# Patient Record
Sex: Female | Born: 1945 | Race: White | Hispanic: No | Smoking: Never smoker
Health system: Southern US, Community
[De-identification: ages and names within clinical notes are randomized; demographics above are authoritative.]

## PROBLEM LIST (undated history)

## (undated) DIAGNOSIS — I10 Essential (primary) hypertension: Secondary | ICD-10-CM

## (undated) DIAGNOSIS — E669 Obesity, unspecified: Secondary | ICD-10-CM

## (undated) DIAGNOSIS — K219 Gastro-esophageal reflux disease without esophagitis: Secondary | ICD-10-CM

## (undated) DIAGNOSIS — C801 Malignant (primary) neoplasm, unspecified: Secondary | ICD-10-CM

## (undated) DIAGNOSIS — N950 Postmenopausal bleeding: Secondary | ICD-10-CM

## (undated) DIAGNOSIS — F419 Anxiety disorder, unspecified: Secondary | ICD-10-CM

## (undated) DIAGNOSIS — I4891 Unspecified atrial fibrillation: Secondary | ICD-10-CM

## (undated) DIAGNOSIS — F329 Major depressive disorder, single episode, unspecified: Secondary | ICD-10-CM

## (undated) DIAGNOSIS — E785 Hyperlipidemia, unspecified: Secondary | ICD-10-CM

## (undated) DIAGNOSIS — F32A Depression, unspecified: Secondary | ICD-10-CM

## (undated) DIAGNOSIS — R600 Localized edema: Secondary | ICD-10-CM

## (undated) DIAGNOSIS — E119 Type 2 diabetes mellitus without complications: Secondary | ICD-10-CM

## (undated) DIAGNOSIS — N6019 Diffuse cystic mastopathy of unspecified breast: Secondary | ICD-10-CM

## (undated) DIAGNOSIS — R7303 Prediabetes: Secondary | ICD-10-CM

## (undated) HISTORY — PX: OTHER SURGICAL HISTORY: SHX169

## (undated) HISTORY — PX: BREAST CYST ASPIRATION: SHX578

## (undated) HISTORY — PX: CARPAL TUNNEL RELEASE: SHX101

## (undated) HISTORY — PX: FRACTURE SURGERY: SHX138

## (undated) HISTORY — PX: KNEE SURGERY: SHX244

---

## 2004-06-12 ENCOUNTER — Ambulatory Visit: Payer: Self-pay | Admitting: Internal Medicine

## 2005-07-19 ENCOUNTER — Ambulatory Visit: Payer: Self-pay | Admitting: Internal Medicine

## 2005-11-11 ENCOUNTER — Other Ambulatory Visit: Payer: Self-pay

## 2005-11-11 ENCOUNTER — Ambulatory Visit: Payer: Self-pay | Admitting: Unknown Physician Specialty

## 2006-11-21 ENCOUNTER — Ambulatory Visit: Payer: Self-pay | Admitting: Internal Medicine

## 2008-02-17 ENCOUNTER — Ambulatory Visit: Payer: Self-pay | Admitting: Internal Medicine

## 2008-08-06 ENCOUNTER — Emergency Department: Payer: Self-pay | Admitting: Emergency Medicine

## 2009-02-23 ENCOUNTER — Ambulatory Visit: Payer: Self-pay | Admitting: Internal Medicine

## 2010-03-15 ENCOUNTER — Ambulatory Visit: Payer: Self-pay | Admitting: Internal Medicine

## 2011-03-21 ENCOUNTER — Ambulatory Visit: Payer: Self-pay | Admitting: Internal Medicine

## 2012-04-08 ENCOUNTER — Ambulatory Visit: Payer: Self-pay | Admitting: Internal Medicine

## 2013-04-09 ENCOUNTER — Ambulatory Visit: Payer: Self-pay | Admitting: Internal Medicine

## 2013-06-04 ENCOUNTER — Ambulatory Visit: Payer: Self-pay | Admitting: Internal Medicine

## 2014-01-11 DIAGNOSIS — Z8639 Personal history of other endocrine, nutritional and metabolic disease: Secondary | ICD-10-CM | POA: Insufficient documentation

## 2014-04-12 ENCOUNTER — Ambulatory Visit: Payer: Self-pay | Admitting: Internal Medicine

## 2015-01-13 ENCOUNTER — Other Ambulatory Visit: Payer: Self-pay | Admitting: Internal Medicine

## 2015-01-13 DIAGNOSIS — Z1231 Encounter for screening mammogram for malignant neoplasm of breast: Secondary | ICD-10-CM

## 2015-04-14 ENCOUNTER — Ambulatory Visit
Admission: RE | Admit: 2015-04-14 | Discharge: 2015-04-14 | Disposition: A | Discharge status: Home / Self Care | Payer: Medicare Other | Source: Ambulatory Visit | Attending: Internal Medicine | Admitting: Internal Medicine

## 2015-04-14 DIAGNOSIS — Z1231 Encounter for screening mammogram for malignant neoplasm of breast: Secondary | ICD-10-CM

## 2015-04-15 ENCOUNTER — Other Ambulatory Visit: Payer: Self-pay | Admitting: Internal Medicine

## 2015-04-15 DIAGNOSIS — R928 Other abnormal and inconclusive findings on diagnostic imaging of breast: Secondary | ICD-10-CM

## 2015-04-28 ENCOUNTER — Ambulatory Visit
Admission: RE | Admit: 2015-04-28 | Discharge: 2015-04-28 | Disposition: A | Discharge status: Home / Self Care | Payer: Medicare Other | Source: Ambulatory Visit | Attending: Internal Medicine | Admitting: Internal Medicine

## 2015-04-28 DIAGNOSIS — R928 Other abnormal and inconclusive findings on diagnostic imaging of breast: Secondary | ICD-10-CM | POA: Diagnosis not present

## 2016-01-24 ENCOUNTER — Other Ambulatory Visit: Payer: Self-pay | Admitting: Internal Medicine

## 2016-01-24 DIAGNOSIS — Z1231 Encounter for screening mammogram for malignant neoplasm of breast: Secondary | ICD-10-CM

## 2016-04-16 ENCOUNTER — Ambulatory Visit: Payer: Medicare Other

## 2016-06-15 ENCOUNTER — Ambulatory Visit
Admission: RE | Admit: 2016-06-15 | Discharge: 2016-06-15 | Disposition: A | Discharge status: Home / Self Care | Payer: Medicare Other | Source: Ambulatory Visit | Attending: Internal Medicine | Admitting: Internal Medicine

## 2016-06-15 DIAGNOSIS — Z1231 Encounter for screening mammogram for malignant neoplasm of breast: Secondary | ICD-10-CM

## 2016-08-16 IMAGING — MG MM DIAG BREAST TOMO UNI LEFT
6 series · 6 of 14 positions shown · non-contrast
Comparison: Previous exam(s).

CLINICAL DATA: 69-year-old female with possible left breast
asymmetry. The patient states she has had chronic nipple inversion
for many years.

EXAM:
DIGITAL DIAGNOSTIC LEFT MAMMOGRAM WITH 3D TOMOSYNTHESIS AND CAD

[L MLO synth-2D]
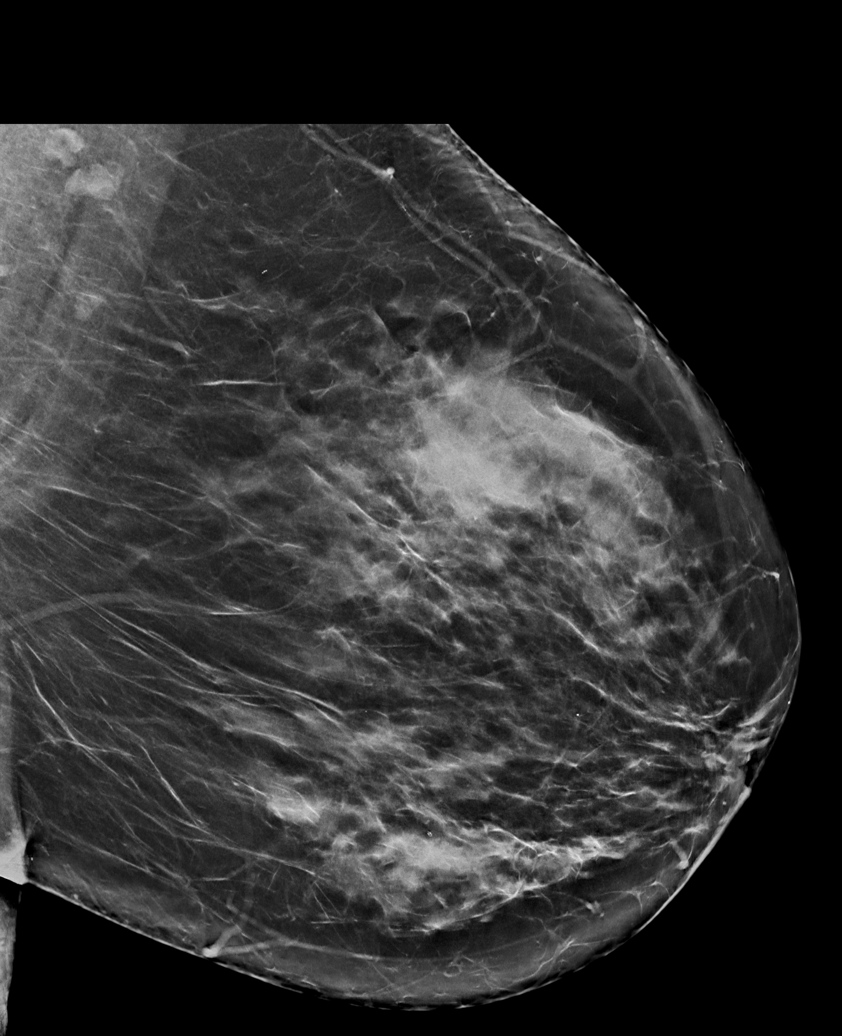

[L MLO]
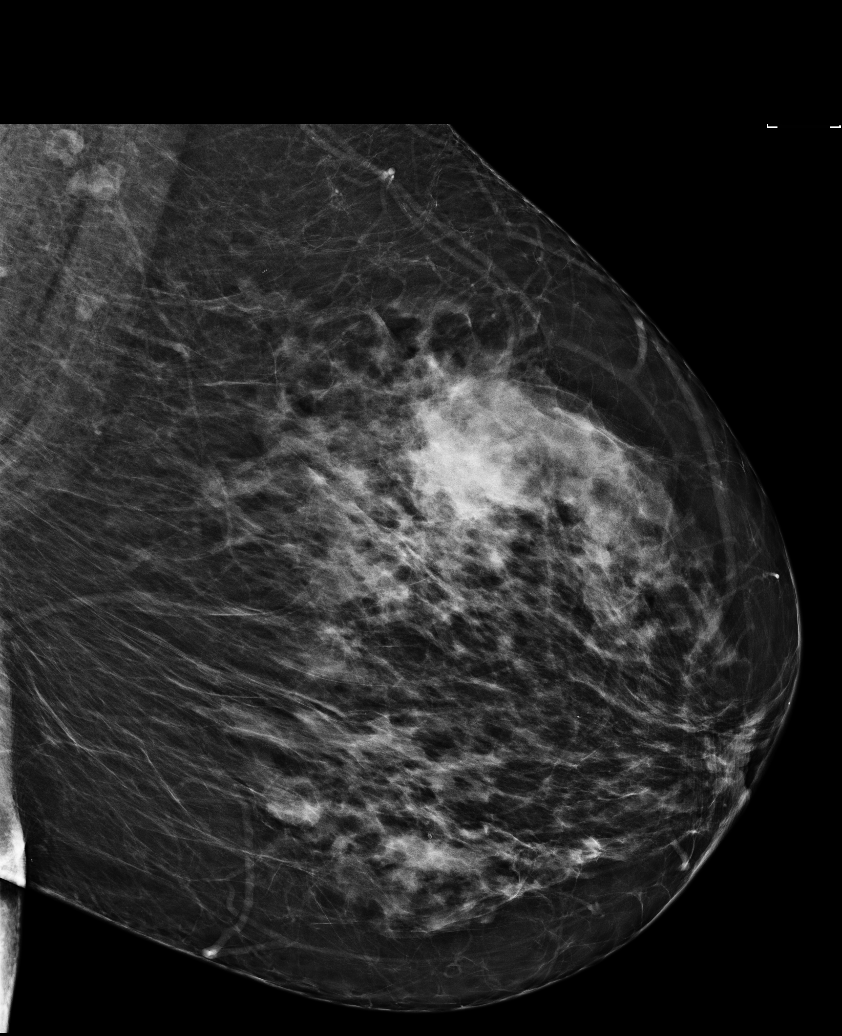

[L CC]
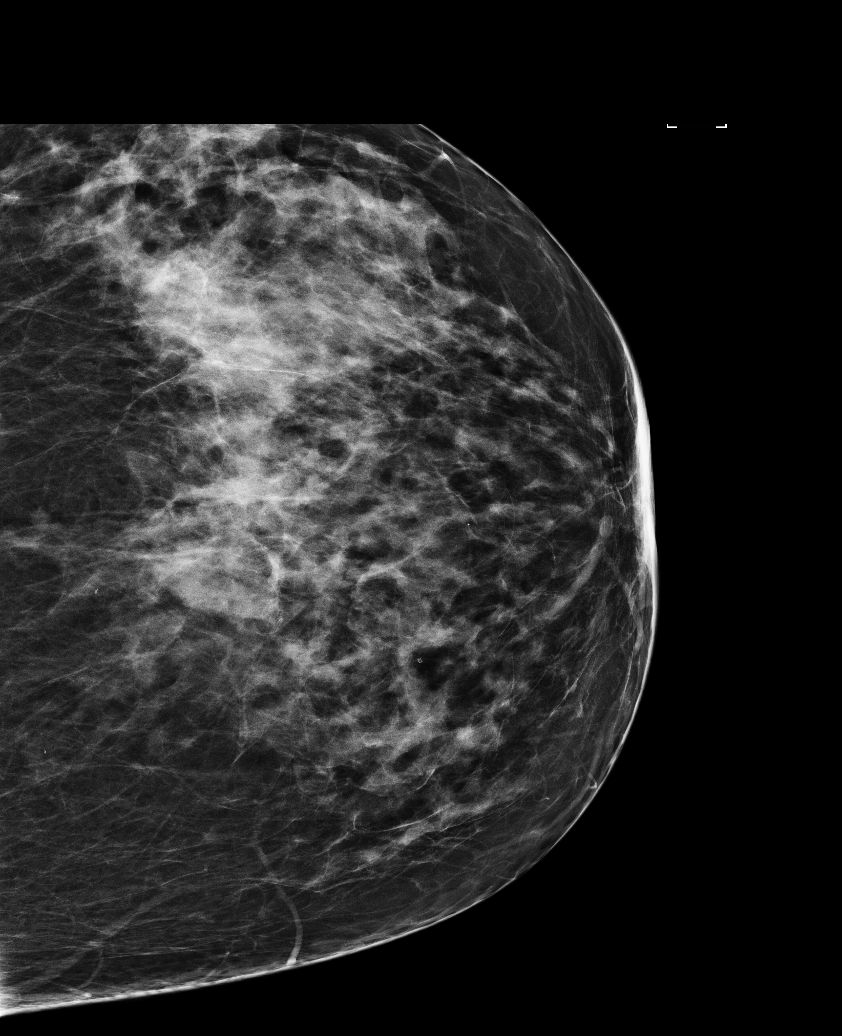

[L CC synth-2D]
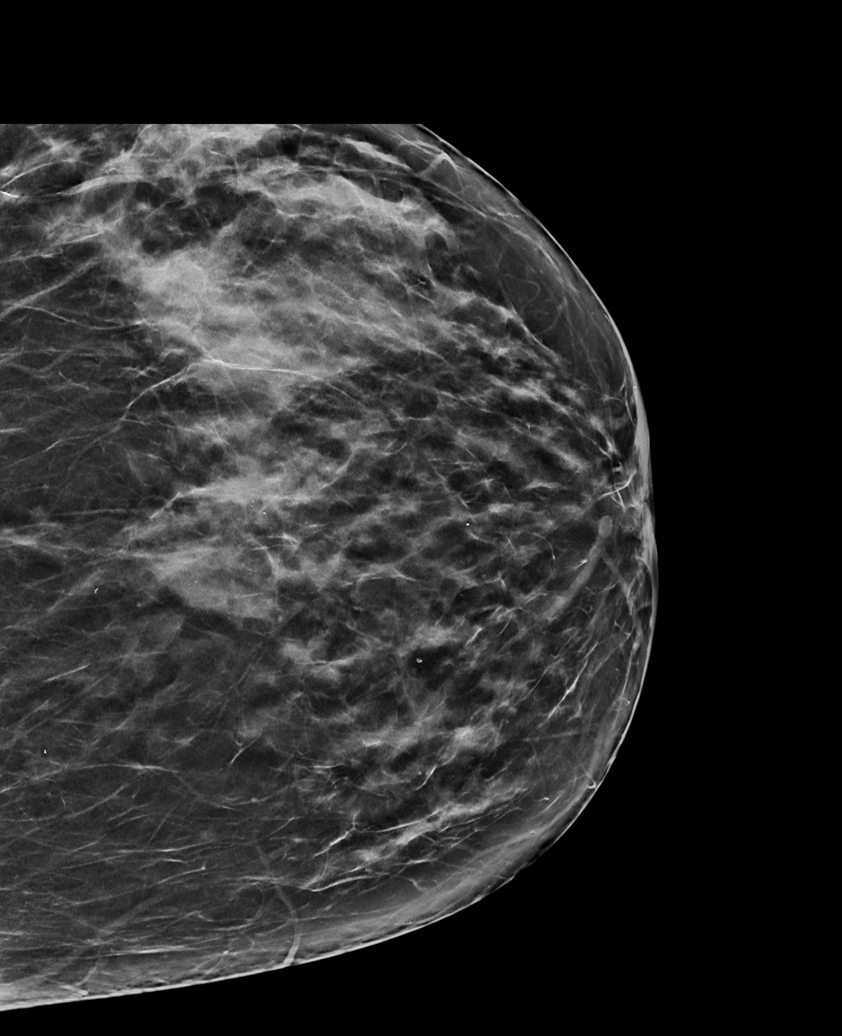

[L MLO tomo · tomo slice 47/94.0]
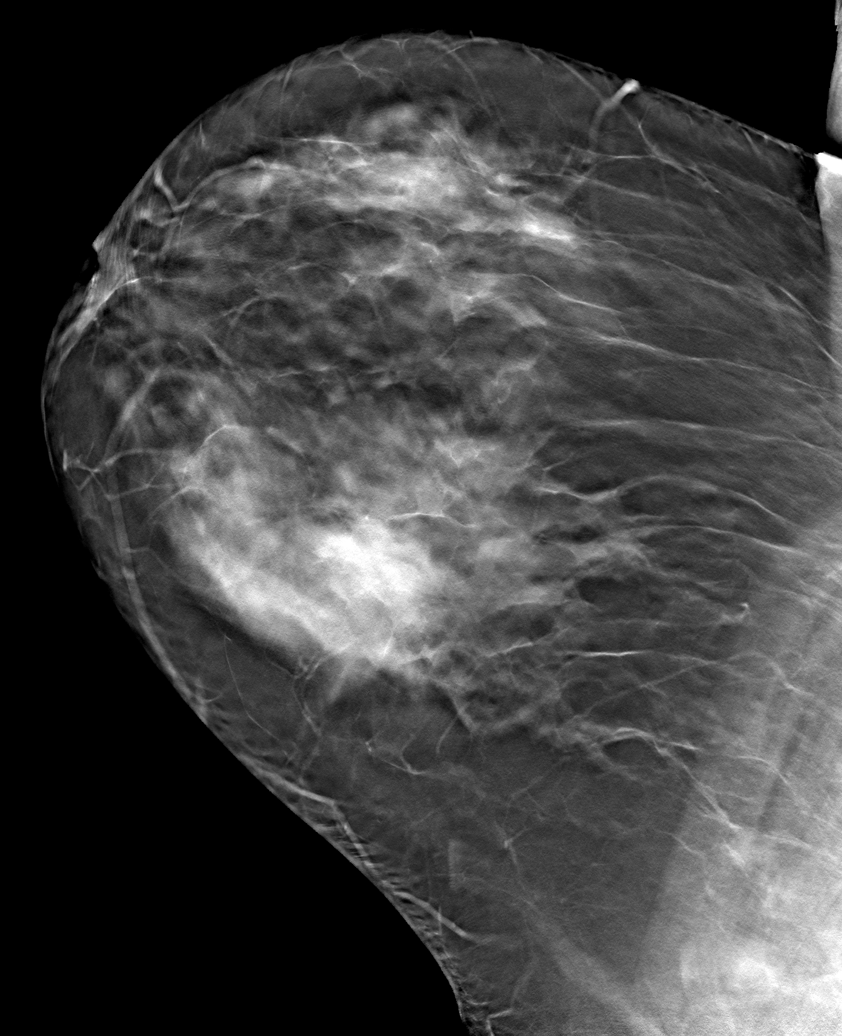

[L CC tomo · tomo slice 37/74.0]
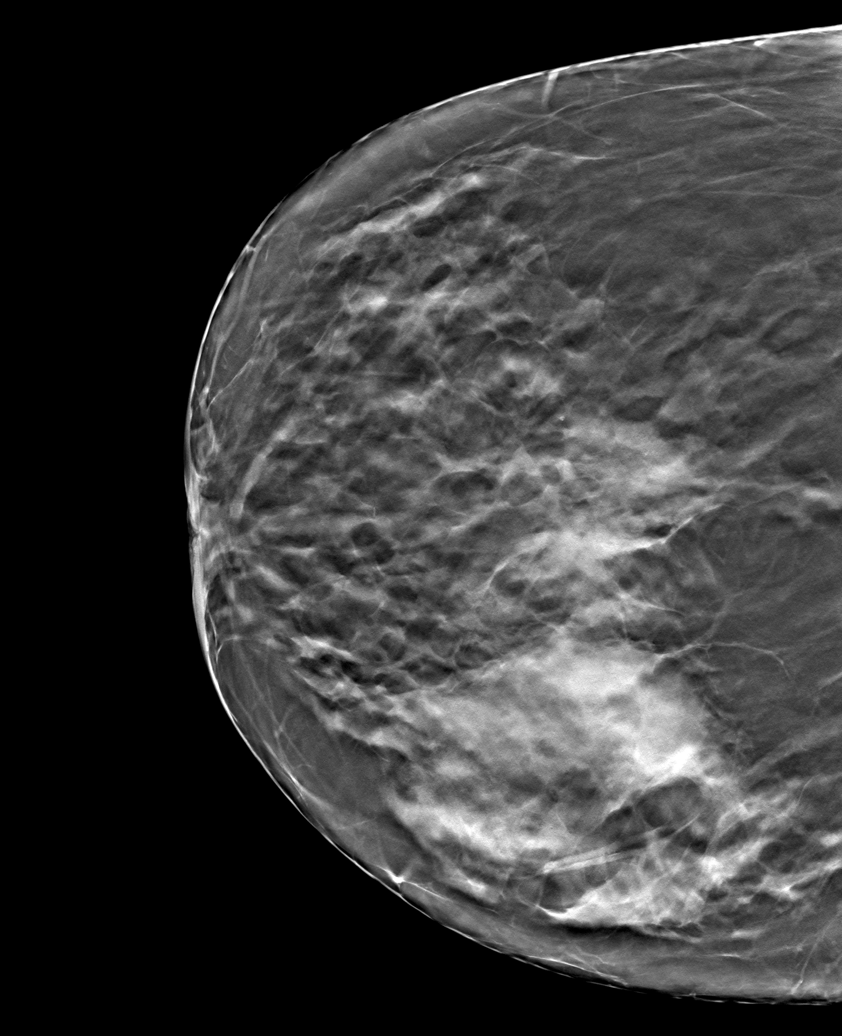

[6 of 14 positions shown; findings below may reference images not displayed]

ACR Breast Density Category b: There are scattered areas of
fibroglandular density.
FINDINGS: 2D and 3D full field views of the left breast demonstrate no
suspicious mass, distortion or worrisome calcifications.

No persistent abnormality in the area of the screening study finding
identified.

Mammographic images were processed with CAD.
IMPRESSION: No persistent abnormality in the area of the screening study
finding.

No mammographic evidence of left breast malignancy.

RECOMMENDATION:
Bilateral screening mammograms in 1 year.

I have discussed the findings and recommendations with the patient.
Results were also provided in writing at the conclusion of the
visit. If applicable, a reminder letter will be sent to the patient
regarding the next appointment.

BI-RADS CATEGORY  1: Negative.

## 2017-01-17 ENCOUNTER — Emergency Department
Admission: EM | Admit: 2017-01-17 | Discharge: 2017-01-17 | Disposition: A | Discharge status: Home / Self Care | Payer: Medicare Other | Attending: Emergency Medicine | Admitting: Emergency Medicine

## 2017-01-17 ENCOUNTER — Emergency Department: Payer: Medicare Other

## 2017-01-17 ENCOUNTER — Encounter: Payer: Self-pay | Admitting: *Deleted

## 2017-01-17 DIAGNOSIS — I4891 Unspecified atrial fibrillation: Secondary | ICD-10-CM

## 2017-01-17 DIAGNOSIS — Z7901 Long term (current) use of anticoagulants: Secondary | ICD-10-CM | POA: Insufficient documentation

## 2017-01-17 DIAGNOSIS — E876 Hypokalemia: Secondary | ICD-10-CM | POA: Insufficient documentation

## 2017-01-17 DIAGNOSIS — R000 Tachycardia, unspecified: Secondary | ICD-10-CM | POA: Diagnosis present

## 2017-01-17 LAB — PROTIME-INR
INR: 1.23
Prothrombin Time: 15.4 seconds — ABNORMAL HIGH (ref 11.4–15.2)

## 2017-01-17 LAB — CBC
HCT: 44.3 % (ref 35.0–47.0)
Hemoglobin: 15 g/dL (ref 12.0–16.0)
MCH: 30.4 pg (ref 26.0–34.0)
MCHC: 33.7 g/dL (ref 32.0–36.0)
MCV: 90.1 fL (ref 80.0–100.0)
PLATELETS: 274 10*3/uL (ref 150–440)
RBC: 4.92 MIL/uL (ref 3.80–5.20)
RDW: 14.8 % — AB (ref 11.5–14.5)
WBC: 11.4 10*3/uL — AB (ref 3.6–11.0)

## 2017-01-17 LAB — BASIC METABOLIC PANEL
Anion gap: 12 (ref 5–15)
BUN: 13 mg/dL (ref 6–20)
CHLORIDE: 100 mmol/L — AB (ref 101–111)
CO2: 26 mmol/L (ref 22–32)
CREATININE: 0.91 mg/dL (ref 0.44–1.00)
Calcium: 9.7 mg/dL (ref 8.9–10.3)
GFR calc Af Amer: >60 mL/min (ref >60)
GFR calc non Af Amer: >60 mL/min (ref >60)
Glucose, Bld: 122 mg/dL — ABNORMAL HIGH (ref 65–99)
Potassium: 3.2 mmol/L — ABNORMAL LOW (ref 3.5–5.1)
SODIUM: 138 mmol/L (ref 135–145)

## 2017-01-17 LAB — TROPONIN I: Troponin I: <0.03 ng/mL (ref <0.03)

## 2017-01-17 MED ORDER — METOPROLOL TARTRATE 50 MG PO TABS
50.0000 mg | ORAL_TABLET | Freq: Once | ORAL | Status: AC
  Administered 2017-01-17: 50 mg via ORAL
  Filled 2017-01-17: qty 1

## 2017-01-17 MED ORDER — POTASSIUM CHLORIDE CRYS ER 20 MEQ PO TBCR
40.0000 meq | EXTENDED_RELEASE_TABLET | Freq: Once | ORAL | Status: AC
  Administered 2017-01-17: 40 meq via ORAL
  Filled 2017-01-17: qty 2

## 2017-01-17 MED ORDER — METOPROLOL TARTRATE 50 MG PO TABS
100.0000 mg | ORAL_TABLET | Freq: Two times a day (BID) | ORAL | 0 refills | Status: DC
Start: 2017-01-17 — End: 2022-09-03

## 2017-01-17 MED ORDER — DILTIAZEM HCL 60 MG PO TABS
120.0000 mg | ORAL_TABLET | Freq: Once | ORAL | Status: AC
  Administered 2017-01-17: 120 mg via ORAL
  Filled 2017-01-17: qty 2

## 2017-01-17 NOTE — Discharge Instructions (Signed)
PLEASE STOP YOUR LOSARTAN/HCTZ.  INCREASE YOUR METOPROLOL TO 100mg  TWICE DAILY.  Continue all your other medications as prescribed.  Return to the emergency department if you develop severe pain, shortness breath, lightheadedness or fainting, or any other symptoms concerning to you.

## 2017-01-17 NOTE — ED Triage Notes (Addendum)
Pt to triage via wheelchair,.  Pt reports rapid heart rate.  No chest pain.  No sob.  Pt had episode of diaphoresis this am x 2.  Pt alert   Speech clear.   New onset of afib 1 month ago and is now on blood thinners.  Treated by dr.  Nehemiah Massed

## 2017-01-17 NOTE — ED Provider Notes (Addendum)
[facility] Emergency Department Provider Note  ____________________________________________  Time seen: Approximately 6:29 PM  I have reviewed the triage vital signs and the nursing notes.   HISTORY  Chief Complaint Tachycardia    HPI Brandi Poole is a 71 y.o. female with a history of afib on Eliquis, metoprolol, diltiazem and Losartan/HCTZ presenting w/ palpitations.  The patient reports that earlier today, she developed diaphoresis, with associated palpitations. She then had a discomfort in her left arm. She denies any chest pain, tightness or pressure. She has not had any lower extremity swelling or calf pain. No shortness of breath. The patient noted that her heart rate was fast, so came into the emergency department for further evaluation. Given her morning medications but not her evening doses. At this time, the patient is feeling significantly better and on my examination her heart rate is in the low 100s.   No past medical history on file.  There are no active problems to display for this patient.   No past surgical history on file.    Allergies Patient has no known allergies.  Family History  Problem Relation Age of Onset  . Breast cancer Neg Hx     Social History Social History  Substance Use Topics  . Smoking status: Never Smoker  . Smokeless tobacco: Never Used  . Alcohol use No    Review of Systems Constitutional: No fever/chills.No lightheadedness or syncope. Positive diaphoresis. Eyes: No visual changes. ENT: No sore throat. No congestion or rhinorrhea. Cardiovascular: Denies chest pain. Positive palpitations. Respiratory: Denies shortness of breath.  No cough. Gastrointestinal: No abdominal pain.  No nausea, no vomiting.  No diarrhea.  No constipation. Genitourinary: Negative for dysuria. Musculoskeletal: Negative for back pain. No lower extremity swelling or calf pain. Skin: Negative for rash. Neurological: Negative  for headaches. No focal numbness, tingling or weakness.  Psych: + Anxiety   ____________________________________________   PHYSICAL EXAM:  VITAL SIGNS: ED Triage Vitals  Enc Vitals Group     BP 01/17/17 1739 137/79     Pulse Rate 01/17/17 1739 (!) 134     Resp 01/17/17 1739 20     Temp 01/17/17 1739 98.6 F (37 C)     Temp Source 01/17/17 1739 Oral     SpO2 01/17/17 1739 96 %     Weight 01/17/17 1723 197 lb (89.4 kg)     Height 01/17/17 1723 5\' 4"  (1.626 m)     Head Circumference --      Peak Flow --      Pain Score --      Pain Loc --      Pain Edu? --      Excl. in [place]? --      Constitutional: Alert and oriented. Anxious appearing but in no acute distress. Answers questions appropriately. Eyes: Conjunctivae are normal.  EOMI. No scleral icterus. Head: Atraumatic. Nose: No congestion/rhinnorhea. Mouth/Throat: Mucous membranes are moist.  Neck: No stridor.  Supple.  No JVD. No meningismus. Cardiovascular: Fast rate,irregular rhythm. No murmurs, rubs or gallops.  Respiratory: Normal respiratory effort.  No accessory muscle use or retractions. Lungs CTAB.  No wheezes, rales or ronchi. Gastrointestinal: Obese. Soft, nontender and nondistended.  No guarding or rebound.  No peritoneal signs. Musculoskeletal: No LE edema. No ttp in the calves or palpable cords.  Negative Homan's sign. Neurologic:  A&Ox3.  Speech is clear.  Face and smile are symmetric.  EOMI.  Moves all extremities well. Skin:  Skin is warm,  dry and intact. No rash noted. Psychiatric: The patient is anxious mood and affect.Marland Kitchen Speech and behavior are normal.  Normal judgement.* ____________________________________________   LABS (all labs ordered are listed, but only abnormal results are displayed)  Labs Reviewed  BASIC METABOLIC PANEL - Abnormal; Notable for the following:       Result Value   Potassium 3.2 (*)    Chloride 100 (*)    Glucose, Bld 122 (*)    All other components within normal limits  CBC  - Abnormal; Notable for the following:    WBC 11.4 (*)    RDW 14.8 (*)    All other components within normal limits  PROTIME-INR - Abnormal; Notable for the following:    Prothrombin Time 15.4 (*)    All other components within normal limits  TROPONIN I   ____________________________________________  EKG  ED ECG REPORT I, Eula Listen, the attending physician, personally viewed and interpreted this ECG.   Date: 01/17/2017  EKG Time: 1725  Rate: 136  Rhythm: atrial fibrillation, with rapid ventricular rate  Axis: leftward  Intervals:none  ST&T Change: No STEMI  ____________________________________________  RADIOLOGY  Dg Chest 2 View  Result Date: 01/17/2017 CLINICAL DATA:  Tachycardia. EXAM: CHEST  2 VIEW COMPARISON:  None. FINDINGS: The heart size and mediastinal contours are within normal limits. Mild central pulmonary vascular congestion is noted. Atherosclerosis of thoracic aorta is noted. No pneumothorax or pleural effusion is noted. Both lungs are clear. The visualized skeletal structures are unremarkable. IMPRESSION: Mild central pulmonary vascular congestion.  Aortic atherosclerosis. Electronically Signed   By: Marijo Conception, M.D.   On: 01/17/2017 18:19    ____________________________________________   PROCEDURES  Procedure(s) performed: None  Procedures  Critical Care performed: yes ____________________________________________   INITIAL IMPRESSION / ASSESSMENT AND PLAN / ED COURSE  Pertinent labs & imaging results that were available during my care of the patient were reviewed by me and considered in my medical decision making (see chart for details).  71 y.o. female with newly diagnosed atrial fibrillation, already anticoagulated, presenting with palpitations and an EKG consistent with A. fib with RVR. It is likely that the patient is not yet optimized on her medications, and I will treat her with diltiazem and metoprolol here. We will also look  for any evidence of ACS or MI, and at this time her EKG is reassuring without ischemic changes. We will check her electrolytes, and reevaluate the patient for final disposition.  ----------------------------------------- 6:55 PM on 01/17/2017 -----------------------------------------  The patient's heart rate has normalized after rate control medications. I spoken with Dr. Drusilla Kanner, the patient's primary cardiologist, who agrees with the plan for discharge. His recommendation is for the patient to stop her losartan/HCTZ and increase her metoprolol to 100 mg twice a day. I have given her these instructions, as well as precautions for orthostasis or risk of associated hypotension. The patient understands return precautions as well as follow-up instructions.  CRITICAL CARE Performed by: Eula Listen   Total critical care time: 35 minutes  Critical care time was exclusive of separately billable procedures and treating other patients.  Critical care was necessary to treat or prevent imminent or life-threatening deterioration.  Critical care was time spent personally by me on the following activities: development of treatment plan with patient and/or surrogate as well as nursing, discussions with consultants, evaluation of patient's response to treatment, examination of patient, obtaining history from patient or surrogate, ordering and performing treatments and interventions, ordering and review of laboratory  studies, ordering and review of radiographic studies, pulse oximetry and re-evaluation of patient's condition.   ____________________________________________  FINAL CLINICAL IMPRESSION(S) / ED DIAGNOSES  Final diagnoses:  Hypokalemia  Atrial fibrillation with RVR (HCC)         NEW MEDICATIONS STARTED DURING THIS VISIT:  New Prescriptions   METOPROLOL TARTRATE (LOPRESSOR) 50 MG TABLET    Take 2 tablets (100 mg total) by mouth 2 (two) times daily.      Eula Listen, MD 01/17/17 Kathaleen Maser    Eula Listen, MD 01/17/17 [phone]

## 2017-01-31 ENCOUNTER — Encounter
Admission: RE | Admit: 2017-01-31 | Discharge: 2017-01-31 | Disposition: A | Discharge status: Home / Self Care | Payer: Medicare Other | Source: Ambulatory Visit | Attending: Obstetrics & Gynecology | Admitting: Obstetrics & Gynecology

## 2017-01-31 HISTORY — DX: Gastro-esophageal reflux disease without esophagitis: K21.9

## 2017-01-31 HISTORY — DX: Essential (primary) hypertension: I10

## 2017-01-31 HISTORY — DX: Major depressive disorder, single episode, unspecified: F32.9

## 2017-01-31 HISTORY — DX: Unspecified atrial fibrillation: I48.91

## 2017-01-31 HISTORY — DX: Depression, unspecified: F32.A

## 2017-01-31 HISTORY — DX: Type 2 diabetes mellitus without complications: E11.9

## 2017-01-31 HISTORY — DX: Anxiety disorder, unspecified: F41.9

## 2017-01-31 NOTE — Patient Instructions (Signed)
Your procedure is scheduled on: 02-08-17 FRIDAY Report to Same Day Surgery 2nd floor medical mall [facility] Entrance-take elevator on left to 2nd floor.  Check in with surgery information desk.) To find out your arrival time please call [phone] between 1PM - 3PM on 02-07-17 THURSDAY  Remember: Instructions that are not followed completely may result in serious medical risk, up to and including death, or upon the discretion of your surgeon and anesthesiologist your surgery may need to be rescheduled.    _x___ 1. Do not eat food after midnight the night before your procedure. NO GUM CHEWING OR HARD CANDIES.  You may drink clear liquids up to 2 hours before you are scheduled to arrive at the hospital for your procedure.  Do not drink clear liquids within 2 hours of your scheduled arrival to the hospital.  Clear liquids include  --Water or Apple juice without pulp  --Clear carbohydrate beverage such as ClearFast or Gatorade  --Black Coffee or Clear Tea (No milk, no creamers, do not add anything to the coffee or Tea)  Type 1 and type 2 diabetics should only drink water.     __x__ 2. No Alcohol for 24 hours before or after surgery.   __x__3. No Smoking for 24 prior to surgery.   ____  4. Bring all medications with you on the day of surgery if instructed.    __x__ 5. Notify your doctor if there is any change in your medical condition     (cold, fever, infections).     Do not wear jewelry, make-up, hairpins, clips or nail polish.  Do not wear lotions, powders, or perfumes. You may wear deodorant.  Do not shave 48 hours prior to surgery. Men may shave face and neck.  Do not bring valuables to the hospital.    [facility] is not responsible for any belongings or valuables.               Contacts, dentures or bridgework may not be worn into surgery.  Leave your suitcase in the car. After surgery it may be brought to your room.  For patients admitted to the hospital, discharge time  is determined by your treatment team.   Patients discharged the day of surgery will not be allowed to drive home.  You will need someone to drive you home and stay with you the night of your procedure.    Please read over the following fact sheets that you were given:   [facility] Preparing for Surgery and or MRSA Information   _x___ TAKE THE FOLLOWING MEDICATIONS THE MORNING OF SURGERY WITH A SMALL SIP OF WATER. These include:  1. LIPITOR (ATORVASTATIN)  2. DILITAZEM  3. METOPROLOL  4. PEPCID (FAMOTIDINE)  5. TAKE AN EXTRA PEPCID ON Thursday NIGHT BEFORE BED (02-07-17)  6.  ____Fleets enema or Magnesium Citrate as directed.   _x___ Use CHG Soap or sage wipes as directed on instruction sheet   ____ Use inhalers on the day of surgery and bring to hospital day of surgery  ____ Stop Metformin and Janumet 2 days prior to surgery.    ____ Take 1/2 of usual insulin dose the night before surgery and none on the morning surgery.   _x___ Follow recommendations from Cardiologist, Pulmonologist or PCP regarding stopping Aspirin, Coumadin, Plavix ,Eliquis, Effient, or Pradaxa, and Pletal-CALL EITHER DR WARDS OR DR Fabiola Backer OFFICE REGARDING YOUR COUMADIN  X____Stop Anti-inflammatories such as Advil, Aleve, Ibuprofen, Motrin, Naproxen, Naprosyn, Goodies powders or aspirin products  NOW-OK to take Tylenol    ____ Stop supplements until after surgery.     ____ Bring C-Pap to the hospital.

## 2017-01-31 NOTE — Pre-Procedure Instructions (Signed)
Echo complete10/06/2016 Antoine Component Name Value Ref Range  LV Ejection Fraction (%) 50   Aortic Valve Regurgitation Grade trivial   Aortic Valve Stenosis Grade none   Aortic Valve Max Velocity (m/s) 1.3 m/sec  Aortic Valve Stenosis Mean Gradient (mmHg) 3.0 mmHg  Mitral Valve Regurgitation Grade mild   Mitral Valve Stenosis Grade none   Tricuspid Valve Regurgitation Grade mild   Tricuspid Valve Regurgitation Max Velocity (m/s) 2.9 m/sec  Right Ventricle Systolic Pressure (mmHg) 23.3 mmHg  LV End Diastolic Diameter (cm) 4.6 cm  LV End Systolic Diameter (cm) 3.5 cm  LV Septum Wall Thickness (cm) 0.83 cm  LV Posterior Wall Thickness (cm) 0.86 cm  Left Atrium Diameter (cm) 3.4 cm  Result Narrative  CARDIOLOGY DEPARTMENT EMIKA, TIANO [id number] [facility]ICEAcct #: [id number] [address], [place], [place] [address]Date: 01/30/2017 08:14 AM Adult Female Age: 71 yrs ECHOCARDIOGRAM REPORT Outpatient KC::KCWC  STUDY:CHEST WALL TAPE:0000:00: 0:00:00 MD1:KOWALSKI, BRUCE JAY ECHO:Yes DOPPLER:YesFILE:0000-000-000 BP: 108/72 mmHg  COLOR:YesCONTRAST:No MACHINE:Philips Height: 64 in  RV BIOPSY:No 3D:NoSOUND QLTY:ModerateWeight: 198 lb MEDIUM:None BSA: 1.9 m2  ___________________________________________________________________________________________  HISTORY:Recent A.Fib REASON:Assess, LV function INDICATION:I48.91 Unspecified atrial  fibrillation ___________________________________________________________________________________________  ECHOCARDIOGRAPHIC MEASUREMENTS 2D DIMENSIONS AORTA ValuesNormal RangeMAIN PAValuesNormal Range Annulus:nm* [2.1 - 2.5]PA Main:nm* [1.5 - 2.1] Aorta Sin:nm* [2.7 - 3.3] RIGHT VENTRICLE ST Junction:nm* [2.3 - 2.9]RV Base:nm* [ < 4.2] Asc.Aorta:nm* [2.3 - 3.1] RV Mid:nm* [ < 3.5]  LEFT VENTRICLERV Length:nm* [ < 8.6] LVIDd:4.6 cm[3.9 - 5.3] INFERIOR VENA CAVA LVIDs:3.5 cmMax. IVC:nm* [ <= 2.1]  FS:25.6 %[> 25]Min. IVC:nm* SWT:0.83 cm [0.5 - 0.9] ------------------ PWT:0.86 cm [0.5 - 0.9] nm* - not measured  LEFT ATRIUM LA Diam:3.4 cm[2.7 - 3.8] LA A4C Area:nm* [ < 20] LA Volume:nm* [22 - 52] ___________________________________________________________________________________________  ECHOCARDIOGRAPHIC DESCRIPTIONS  AORTIC ROOT Size:Normal Dissection:INDETERM FOR DISSECTION  AORTIC VALVE Leaflets:TricuspidMorphology:Normal Mobility:Fully mobile  LEFT VENTRICLE Size:Normal Anterior:Normal  Contraction:NormalLateral:Normal Closest EF:50% (Estimated)Septal:Normal  LV Masses:No MassesApical:Normal  YBW:LSLH Inferior:Normal  Posterior:Normal Dias.FxClass:can't be determined  MITRAL VALVE Leaflets:Normal Mobility:Fully mobile Morphology:Normal  LEFT  ATRIUM Size:MILDLY ENLARGED LA Masses:No masses  IA Septum:Normal IAS  MAIN PA Size:Normal  PULMONIC VALVE Morphology:Normal Mobility:Fully mobile  RIGHT VENTRICLE  RV Masses:No MassesSize:Normal  Free Wall:NormalContraction:Normal  TRICUSPID VALVE Leaflets:Normal Mobility:Fully mobile Morphology:Normal  RIGHT ATRIUM Size:Normal RA Other:None  RA Mass:No masses  PERICARDIUM  Fluid:No effusion  INFERIOR VENACAVA Size:Normal Normal respiratory collapse  ____________________________________________________________________  DOPPLER ECHO and OTHER SPECIAL PROCEDURES  Aortic:TRIVIAL AR No AS 129.0 cm/sec peak vel6.7 mmHg peak grad 3.0 mmHg mean grad 2.8 cm^2 by DOPPLER   Mitral:MILD MRNo MS MV Inflow E Vel=112.0 cm/sec MV Annulus E'Vel=nm* E/E'Ratio=nm*  Tricuspid:MILD TRNo TS 287.0 cm/sec peak TR vel 37.9 mmHg peak RV pressure  Pulmonary:TRIVIAL PR No PS    ___________________________________________________________________________________________  INTERPRETATION NORMAL LEFT VENTRICULAR SYSTOLIC FUNCTION WITH AN ESTIMATED EF = 50-55 % NORMAL RIGHT VENTRICULAR SYSTOLIC FUNCTION MILD-TO-MODERATE TRICUSPID VALVE INSUFFICIENCY MILD MITRAL VALVE INSUFFICIENCY TRACE AORTIC VALVE INSUFFICIENCY NO VALVULAR STENOSIS MILD LA ENLARGEMENT  ___________________________________________________________________________________________  Electronically signed by: MD Serafina Royals on 01/30/2017 09:49 AM Performed By: Johnathan Hausen, RDCS, RVT Ordering Physician: Serafina Royals ___________________________________________________________________________________________  Other  Result Information  Interface, Text Results In - 01/30/2017  9:51 AM EDT                     CARDIOLOGY DEPARTMENT  JENISE, IANNELLI                        [facility]                      [id number]                   Coral #: [id number]         [address] [place], [place] [address]        Date: 01/30/2017 08:14 AM                                                             Adult Female Age: 71 yrs                     ECHOCARDIOGRAM REPORT                   Outpatient                                                             KC::KCWC          STUDY:CHEST WALL         TAPE:0000:00: 0:00:00     MD1:  Serafina Royals JAY           ECHO:Yes   DOPPLER:Yes  FILE:0000-000-000         BP: 108/72 mmHg          COLOR:Yes  CONTRAST:No       MACHINE:Philips       Height: 64 in      RV BIOPSY:No         3D:No    SOUND QLTY:Moderate      Weight: 198 lb         MEDIUM:None                                         BSA: 1.9 m2  ___________________________________________________________________________________________            HISTORY:Recent A.Fib             REASON:Assess, LV function         INDICATION:I48.91 Unspecified atrial fibrillation ___________________________________________________________________________________________  ECHOCARDIOGRAPHIC MEASUREMENTS 2D DIMENSIONS AORTA             Values      Normal Range      MAIN PA          Values      Normal Range           Annulus:  nm*       [2.1 - 2.5]                PA Main:  nm*       [1.5 - 2.1]  Aorta Sin:  nm*       [2.7 - 3.3]       RIGHT VENTRICLE       ST Junction:  nm*       [2.3 - 2.9]                RV Base:  nm*       [ < 4.2]         Asc.Aorta:  nm*       [2.3 - 3.1]                 RV Mid:  nm*       [ < 3.5]  LEFT VENTRICLE                                        RV Length:  nm*       [ < 8.6]             LVIDd:  4.6 cm    [3.9 - 5.3]       INFERIOR VENA  CAVA             LVIDs:  3.5 cm                              Max. IVC:  nm*       [ <= 2.1]                FS:  25.6 %    [> 25]                    Min. IVC:  nm*               SWT:  0.83 cm   [0.5 - 0.9]                   ------------------               PWT:  0.86 cm   [0.5 - 0.9]                   nm* - not measured  LEFT ATRIUM           LA Diam:  3.4 cm    [2.7 - 3.8]       LA A4C Area:  nm*       [ < 20]         LA Volume:  nm*       [22 - 52] ___________________________________________________________________________________________  ECHOCARDIOGRAPHIC DESCRIPTIONS  AORTIC ROOT         Size:Normal   Dissection:INDETERM FOR DISSECTION  AORTIC VALVE     Leaflets:Tricuspid        Morphology:Normal     Mobility:Fully mobile  LEFT VENTRICLE         Size:Normal             Anterior:Normal  Contraction:Normal              Lateral:Normal   Closest EF:50% (Estimated)      Septal:Normal    LV Masses:No Masses            Apical:Normal          JME:QAST               Inferior:Normal  Posterior:Normal Willette Brace.FxClass:can't be determined  MITRAL VALVE     Leaflets:Normal             Mobility:Fully mobile   Morphology:Normal  LEFT ATRIUM         Size:MILDLY ENLARGED   LA Masses:No masses    IA Septum:Normal IAS  MAIN PA         Size:Normal  PULMONIC VALVE   Morphology:Normal             Mobility:Fully mobile  RIGHT VENTRICLE    RV Masses:No Masses              Size:Normal    Free Wall:Normal          Contraction:Normal  TRICUSPID VALVE     Leaflets:Normal             Mobility:Fully mobile   Morphology:Normal  RIGHT ATRIUM         Size:Normal             RA Other:None      RA Mass:No masses  PERICARDIUM        Fluid:No effusion  INFERIOR VENACAVA         Size:Normal Normal respiratory collapse  ____________________________________________________________________  DOPPLER ECHO and OTHER SPECIAL PROCEDURES    Aortic:TRIVIAL AR                      No AS           129.0 cm/sec peak vel          6.7 mmHg peak grad           3.0 mmHg mean grad             2.8 cm^2 by DOPPLER     Mitral:MILD MR                        No MS           MV Inflow E Vel=112.0 cm/sec   MV Annulus E'Vel=nm*           E/E'Ratio=nm*  Tricuspid:MILD TR                        No TS           287.0 cm/sec peak TR vel       37.9 mmHg peak RV pressure  Pulmonary:TRIVIAL PR                     No PS    ___________________________________________________________________________________________  INTERPRETATION NORMAL LEFT VENTRICULAR SYSTOLIC FUNCTION WITH AN ESTIMATED EF = 50-55 % NORMAL RIGHT VENTRICULAR SYSTOLIC FUNCTION MILD-TO-MODERATE TRICUSPID VALVE INSUFFICIENCY MILD MITRAL VALVE INSUFFICIENCY TRACE AORTIC VALVE INSUFFICIENCY NO VALVULAR STENOSIS MILD LA ENLARGEMENT  ___________________________________________________________________________________________  Electronically signed by: MD Serafina Royals on 01/30/2017 09:49 AM             Performed By: Johnathan Hausen, RDCS, RVT       Ordering Physician: Serafina Royals ___________________________________________________________________________________________  Status Results Details    Appointment on 01/30/2017 [place]")' [email or web address]/">Encounter Summary

## 2017-01-31 NOTE — Pre-Procedure Instructions (Signed)
CALLED AND SPOKE WITH BECKY LYNN AT DR WARDS OFFICE AND LET HER KNOW THAT EITHER THEY NEED TO TELL PT IF SHE NEEDS TO STOP HER COUMADIN OR HER CARDIOLOGIST (DR KOWALSKI) AND THAT THE PT NEEDS TO BE INFORMED OF THIS. BECKY LYNN VERBALIZED [place] WARD KNOW

## 2017-01-31 NOTE — Pre-Procedure Instructions (Signed)
Brandi Dibble, MD - 01/30/2017 9:15 AM EDT Formatting of this note may be different from the original. Established Patient Visit   Chief Complaint: Chief Complaint  Patient presents with  . Follow-up  echo (today)  . Atrial Fibrillation  Date of Service: 01/30/2017 Date of Birth: 08/12/1945 PCP: [name], MD  History of Present Illness: [name] is a 71 y.o.female patient  Paroxysmal atrial fibrillation The patient has non valvular paroxysmal atrial fibrillation. This appears to be occurring Often and can be associated with palpitations and shortness of breath with etiology including Hypertension, structural and/or valvular disease and sick sinus syndrome The average time between episodes has been 4 weeks with the duration of episodes being all day. These associated symptoms have correlated with episodes of atrial fibrillation. The identified symptoms associated with atrial fibrillation have affected the patient's functionality and/or quality of life. Therefore the patient has a severity of atrial fibrillation scale of 2. The patient has been having heart rate control and/or maintenance of normal sinus rhythm with metoprolol and diltiazem and has been on anticoagulation Anticoagulation The patient is currently on anticoagulation for risk reduction in stroke using warfarin without bleeding bruising and/or side effects. The patient understands all risks and benefits of anticoagulation as discussed today and agrees to continuation at this time  Benign Essential Hypertension The patient does have benign essential hypertension which they are receiving appropriate medication management. We have reviewed current treatment medications below. They appear stable at this time. This medical regimen has been tolerated fairly well at this time without apparent significant side effects or other concerns. It appears that the patient has been reaching appropriate blood pressure goals. We have  educated them on the risks and benefits of this medication management. There has also been a discussion of low-sodium diet and activity therapy for improved long-term control. There appears not to be any secondary causes of this condition at this time. Mixed Hyperlipidemia The patient has mixed hyperlipidemia with an LDL of 137 and an HDL of 66. They have other cardiovascular risk factors including age, postmenopausal female, diabetes, HTN and Hyperlipidemia. We have had a long discussion of the reasons for medical management of the mixed hyperlipidemia including high LDL, diabetes and greater than 7.5% ten year cardiovascular score. They have voiced concerns of general medication side effects. We therefore have discussed the risks and benefits of medication management as well as exercise and diet. At this time the patient does not wish to pursue medication management. Diabetes The patient has a significant cardiovascular risk factor including diabetes mellitus for which they are on medication management. The patient understands the significance of future cardiovascular complications with this diagnosis. The patient has current diabetic secondary effects including none. Glucose and hemoglobin A1c levels have improved since last visit with HbA1c 6-6.9%. Current medical management listed below has been discussed. We have discussed appropriate lifestyle changes and medical management including low-carbohydrate diet and the DASH diet. We have had a discussion of medical treatment of diabetes using the gliflozin class of oral medications. There have been studies showing that this class of treatment reduces cardiovascular death while treating diabetes. It includes a 38% reduced risk of cardiovascular death, a 35% reduced risk of congestive heart failure, and a 32% reduced risk of all cause mortality.  Results for orders placed or performed in visit on 01/30/17  Echo complete  Result Value Ref Range  LV Ejection  Fraction (%) 50  Aortic Valve Regurgitation Grade trivial  Aortic Valve Stenosis  Grade none  Aortic Valve Max Velocity (m/s) 1.3 m/sec  Aortic Valve Stenosis Mean Gradient (mmHg) 3.0 mmHg  Mitral Valve Regurgitation Grade mild  Mitral Valve Stenosis Grade none  Tricuspid Valve Regurgitation Grade mild  Tricuspid Valve Regurgitation Max Velocity (m/s) 2.9 m/sec  Right Ventricle Systolic Pressure (mmHg) 38.7 mmHg  LV End Diastolic Diameter (cm) 4.6 cm  LV End Systolic Diameter (cm) 3.5 cm  LV Septum Wall Thickness (cm) 0.83 cm  LV Posterior Wall Thickness (cm) 0.86 cm  Left Atrium Diameter (cm) 3.4 cm  Narrative  CARDIOLOGY DEPARTMENT [place], [place] [id number] [facility] Acct #: [id number] [address] [place], [place] [address] Date: 01/30/2017 08:14 AM Adult Female Age: 41 yrs ECHOCARDIOGRAM REPORT Outpatient KC::KCWC STUDY:CHEST WALL TAPE:0000:00: 0:00:00 MD1: Serafina Royals JAY ECHO:Yes DOPPLER:Yes FILE:0000-000-000 BP: 108/72 mmHg COLOR:Yes CONTRAST:No MACHINE:[place] Height: 64 in RV BIOPSY:No 3D:No SOUND QLTY:Moderate Weight: 198 lb MEDIUM:None BSA: 1.9 m2  ___________________________________________________________________________________________ HISTORY:Recent A.Fib REASON:Assess, LV function INDICATION:I48.91 Unspecified atrial fibrillation ___________________________________________________________________________________________  ECHOCARDIOGRAPHIC MEASUREMENTS 2D DIMENSIONS AORTA Values Normal Range MAIN PA Values Normal Range Annulus: nm* [2.1 - 2.5] PA Main: nm* [1.5 - 2.1] Aorta Sin: nm* [2.7 - 3.3] RIGHT VENTRICLE ST Junction: nm* [2.3 - 2.9] RV Base: nm* [ < 4.2] Asc.Aorta: nm* [2.3 - 3.1] RV Mid: nm* [ < 3.5] LEFT VENTRICLE RV Length: nm* [ < 8.6] LVIDd: 4.6 cm [3.9 - 5.3] INFERIOR VENA CAVA LVIDs: 3.5 cm Max. IVC: nm* [ <= 2.1] FS: 25.6 % [> 25] Min. IVC: nm* SWT: 0.83 cm [0.5 - 0.9] ------------------ PWT: 0.86 cm  [0.5 - 0.9] nm* - not measured LEFT ATRIUM LA Diam: 3.4 cm [2.7 - 3.8] LA A4C Area: nm* [ < 20] LA Volume: nm* [22 - 52] ___________________________________________________________________________________________  ECHOCARDIOGRAPHIC DESCRIPTIONS  AORTIC ROOT Size:Normal Dissection:INDETERM FOR DISSECTION  AORTIC VALVE Leaflets:Tricuspid Morphology:Normal Mobility:Fully mobile  LEFT VENTRICLE Size:Normal Anterior:Normal Contraction:Normal Lateral:Normal Closest EF:50% (Estimated) Septal:Normal LV Masses:No Masses Apical:Normal YSA:YTKZ Inferior:Normal Posterior:Normal Dias.FxClass:can't be determined  MITRAL VALVE Leaflets:Normal Mobility:Fully mobile Morphology:Normal  LEFT ATRIUM Size:MILDLY ENLARGED LA Masses:No masses IA Septum:Normal IAS  MAIN PA Size:Normal  PULMONIC VALVE Morphology:Normal Mobility:Fully mobile  RIGHT VENTRICLE RV Masses:No Masses Size:Normal Free Wall:Normal Contraction:Normal  TRICUSPID VALVE Leaflets:Normal Mobility:Fully mobile Morphology:Normal  RIGHT ATRIUM Size:Normal RA Other:None RA Mass:No masses  PERICARDIUM Fluid:No effusion  INFERIOR VENACAVA Size:Normal Normal respiratory collapse  ____________________________________________________________________  DOPPLER ECHO and OTHER SPECIAL PROCEDURES Aortic:TRIVIAL AR No AS 129.0 cm/sec peak vel 6.7 mmHg peak grad 3.0 mmHg mean grad 2.8 cm^2 by DOPPLER  Mitral:MILD MR No MS MV Inflow E Vel=112.0 cm/sec MV Annulus E'Vel=nm* E/E'Ratio=nm*  Tricuspid:MILD TR No TS 287.0 cm/sec peak TR vel 37.9 mmHg peak RV pressure  Pulmonary:TRIVIAL PR No PS    ___________________________________________________________________________________________  INTERPRETATION NORMAL LEFT VENTRICULAR SYSTOLIC FUNCTION WITH AN ESTIMATED EF = 50-55 % NORMAL RIGHT VENTRICULAR SYSTOLIC FUNCTION MILD-TO-MODERATE TRICUSPID VALVE INSUFFICIENCY MILD MITRAL VALVE INSUFFICIENCY TRACE AORTIC VALVE  INSUFFICIENCY NO VALVULAR STENOSIS MILD LA ENLARGEMENT  ___________________________________________________________________________________________  Electronically signed by: MD Serafina Royals on 01/30/2017 09:49 AM Performed By: Johnathan Hausen, RDCS, RVT Ordering Physician: Serafina Royals ___________________________________________________________________________________________   Past Medical and Surgical History  Past Medical History Past Medical History:  Diagnosis Date  . Anxiety, unspecified  . Atrial fibrillation , unspecified (CMS-HCC)  . Depression, unspecified  . Family history of diabetes mellitus  . Fibrocystic breast disease  . Hx of obesity 01/11/2014  . Hyperlipidemia, unspecified  . Hypertension  . Obesity, unspecified  .  Type 2 diabetes mellitus (CMS-HCC)   Past Surgical History She has a past surgical history that includes Endoscopic Carpal Tunnel Release (Right); knee surgery (Left, 1998); and wrist surgery (Right, 2006).   Medications and Allergies  Current Medications  Current Outpatient Prescriptions on File Prior to Visit  Medication Sig Dispense Refill  . atorvastatin (LIPITOR) 10 MG tablet Take 1 tablet (10 mg total) by mouth once daily. 30 tablet 11  . diltiazem (DILT-XR) 240 MG XR capsule Take 1 capsule (240 mg total) by mouth once daily. 90 capsule 3  . famotidine (PEPCID) 20 MG tablet Take 1 tablet (20 mg total) by mouth once daily. 30 tablet 11  . metoprolol tartrate (LOPRESSOR) 50 MG tablet TAKE ONE TABLET BY MOUTH TWICE DAILY 180 tablet 1  . warfarin (COUMADIN) 2.5 MG tablet Take 1 tablet (2.5 mg total) by mouth once daily. 30 tablet 5   No current facility-administered medications on file prior to visit.   Allergies: Patient has no known allergies.  Social and Family History  Social History reports that she has never smoked. She has never used smokeless tobacco. She reports that she does not drink alcohol or use drugs.  Family  History Family History  Problem Relation Age of Onset  . No Known Problems Mother  . No Known Problems Father  . Diabetes Paternal Grandmother   Review of Systems   Review of Systems  Positive for sob Negative for weight gain weight loss, weakness, vision change, hearing loss, cough, congestion, PND, orthopnea, heartburn, nausea, diaphoresis, vomiting, diarrhea, bloody stool, melena, stomach pain, extremity pain, leg weakness, leg cramping, leg blood clots, headache, blackouts, nosebleed, trouble swallowing, mouth pain, urinary frequency, urination at night, muscle weakness, skin lesions, skin rashes, tingling ,ulcers, numbness, anxiety, and/or depression Physical Examination   Vitals:BP 125/88  Pulse 79  Ht 162.6 cm (5\' 4" )  Wt 89.8 kg (198 lb)  SpO2 95%  BMI 33.99 kg/m  Ht:162.6 cm (5\' 4" ) Wt:89.8 kg (198 lb) YQI:HKVQ surface area is 2.01 meters squared. Body mass index is 33.99 kg/m. Appearance: well appearing in no acute distress HEENT: Pupils equally reactive to light and accomodation, no xanthalasma  Neck: Supple, no apparent thyromegaly, masses, or lymphadenopathy  Lungs: normal respiratory effort; no crackles, no rhonchi, no wheezes Heart: irregular rate and rhythm. Normal S1 S2 No gallops, murmur, no rub, PMI is normal size and placement. carotid upstroke normal without bruit. Jugular venous pressure is normal Abdomen: soft, nontender, not distended with normal bowel sounds. No apparent hepatosplenomegally. Abdominal aorta is normal size without bruit Extremities: no edema, no ulcers, no clubbing, no cyanosis Peripheral Pulses: 2+ in upper extremities, 2+ femoral pulses bilaterally, 2+lower extremity  Musculoskeletal; Normal muscle tone without kyphosis Neurological: Oriented and Alert, Cranial nerves intact  Assessment   71 y.o. female with  Encounter Diagnoses  Name Primary?  . Paroxysmal A-fib (CMS-HCC)  . Essential hypertension  . Type 2 diabetes mellitus  without complication, without long-term current use of insulin (CMS-HCC)  . Pure hypercholesterolemia Yes   Plan  -Heart rate control of atrial fibrillation with possible maintenance of normal sinus rhythm using metoprolol and diltiazem. The patient will continue to monitor for any side effects of these medications and need for adjustments in the future due to side effects and\or atrial fibrillation. -Continue Coumadin and/or warfarin for further risk reduction of cardiovascular stroke risk with atrial fibrillation due to an elevated CHADSVASC Score with a goal INR between 2-3. Patient understands risks and benefits of use  of warfarin including bleeding bruising and/or side effects of medications as well as interactions of other medications. The patient also agrees to regular monitoring of INR -There has been a discussion of the current guidelines for hypertension control. We will continue current medical regimen for hypertension control which will also help in risk factor modification of cardiovascular disease. The patient understands and agrees with the current plan. We will be watching for possible future side effects of these medications. Additional home blood pressure monitoring is recommended if able. -Plan to consider starting statin for further risk reduction of cardiovascular disease, myocardial infarction, peripheral vascular disease, and stroke -Continue aggressive medical management of diabetes following the ABCs for prevention of cardiovascular disease and complications. The goals set forth include a goal HbA1c of less than 7, moderate to high intensity statin use if patient can tolerate, and a goal systolic blood pressure of below 157mm.   No orders of the defined types were placed in this encounter.  Return in about 4 months (around 06/02/2017).  Brandi Dibble, MD       Plan of Treatment - as of this encounter  Upcoming Encounters Upcoming Encounters  Date Type Specialty  Care Team Description  02/21/2017 Post Op Obstetrics and Gynecology Sherrie George, [place]  [place], [place] [address]  [phone]  [phone] (Fax)    05/01/2017 Ancillary Orders Lab [name], MD  [facility] [place], [place] [address]  [phone]  [phone] (Fax)    05/08/2017 Office Visit Internal Medicine [name], MD  [facility] [place], [place] [address]  [phone]  [phone] ([phone]    06/05/2017 Office Visit Cardiology Brandi Dibble, MD  [address]  [facility]  [place], [place] [address]  [phone]  [phone] (Fax)     Goals - as of this encounter  Goal Patient Goal Type Associated Problems Recent Progress Patient-Stated? Author  Reduce Stress/Anxiety  Lifestyle   No Bryson Dames, CMA   Visit Diagnoses   Diagnosis  Pure hypercholesterolemia - Primary  Paroxysmal A-fib (CMS-HCC)  Essential hypertension  Type 2 diabetes mellitus without complication, without long-term current use of insulin (CMS-HCC)   Images Document Information  Primary Care Provider [name] MD (Apr. 10, 2015 - Present) [phone] (Work) [phone] (Fax) [facility] [place], [place] [address]  Document Coverage Dates Oct. 03, 2018  [facility] [address] [place], [place] [address]   Encounter Providers Brandi Dibble MD (Attending) [phone] (Work) [phone] (Fax) [facility] [place], [place] [address]   Encounter Date Oct. 03, 2018

## 2017-02-04 ENCOUNTER — Encounter
Admission: RE | Admit: 2017-02-04 | Discharge: 2017-02-04 | Disposition: A | Discharge status: Home / Self Care | Payer: Medicare Other | Source: Ambulatory Visit | Attending: Obstetrics & Gynecology | Admitting: Obstetrics & Gynecology

## 2017-02-04 DIAGNOSIS — N840 Polyp of corpus uteri: Secondary | ICD-10-CM | POA: Insufficient documentation

## 2017-02-04 DIAGNOSIS — I4891 Unspecified atrial fibrillation: Secondary | ICD-10-CM | POA: Diagnosis present

## 2017-02-04 DIAGNOSIS — N950 Postmenopausal bleeding: Secondary | ICD-10-CM | POA: Insufficient documentation

## 2017-02-04 LAB — TYPE AND SCREEN
ABO/RH(D): A POS
Antibody Screen: NEGATIVE

## 2017-02-04 NOTE — Pre-Procedure Instructions (Signed)
KELLY FROM DR Fabiola Backer OFFICE CALLED AND STATED THAT DR Nehemiah Massed SAID THAT PT NEEDS TO STOP HER COUMADIN 3 DAYS PRIOR TO SURGERY-KELLY STATES THAT SHE ALSO NOTIFIED PT ABOUT THIS

## 2017-02-08 ENCOUNTER — Ambulatory Visit: Payer: Medicare Other | Admitting: Certified Registered Nurse Anesthetist

## 2017-02-08 ENCOUNTER — Encounter: Payer: Self-pay | Admitting: Anesthesiology

## 2017-02-08 ENCOUNTER — Encounter
Admission: RE | Disposition: A | Discharge status: Home / Self Care | Payer: Self-pay | Source: Ambulatory Visit | Attending: Obstetrics & Gynecology

## 2017-02-08 ENCOUNTER — Ambulatory Visit
Admission: RE | Admit: 2017-02-08 | Discharge: 2017-02-08 | Disposition: A | Discharge status: Home / Self Care | Payer: Medicare Other | Source: Ambulatory Visit | Attending: Obstetrics & Gynecology | Admitting: Obstetrics & Gynecology

## 2017-02-08 DIAGNOSIS — Z79899 Other long term (current) drug therapy: Secondary | ICD-10-CM | POA: Insufficient documentation

## 2017-02-08 DIAGNOSIS — E119 Type 2 diabetes mellitus without complications: Secondary | ICD-10-CM | POA: Insufficient documentation

## 2017-02-08 DIAGNOSIS — I4891 Unspecified atrial fibrillation: Secondary | ICD-10-CM | POA: Diagnosis not present

## 2017-02-08 DIAGNOSIS — Z8249 Family history of ischemic heart disease and other diseases of the circulatory system: Secondary | ICD-10-CM | POA: Diagnosis not present

## 2017-02-08 DIAGNOSIS — F329 Major depressive disorder, single episode, unspecified: Secondary | ICD-10-CM | POA: Diagnosis not present

## 2017-02-08 DIAGNOSIS — E785 Hyperlipidemia, unspecified: Secondary | ICD-10-CM | POA: Diagnosis not present

## 2017-02-08 DIAGNOSIS — Z7901 Long term (current) use of anticoagulants: Secondary | ICD-10-CM | POA: Diagnosis not present

## 2017-02-08 DIAGNOSIS — I10 Essential (primary) hypertension: Secondary | ICD-10-CM | POA: Diagnosis not present

## 2017-02-08 DIAGNOSIS — N950 Postmenopausal bleeding: Secondary | ICD-10-CM | POA: Insufficient documentation

## 2017-02-08 DIAGNOSIS — F419 Anxiety disorder, unspecified: Secondary | ICD-10-CM | POA: Diagnosis not present

## 2017-02-08 DIAGNOSIS — N840 Polyp of corpus uteri: Secondary | ICD-10-CM | POA: Insufficient documentation

## 2017-02-08 HISTORY — PX: DILATATION & CURETTAGE/HYSTEROSCOPY WITH MYOSURE: SHX6511

## 2017-02-08 LAB — POCT I-STAT 4, (NA,K, GLUC, HGB,HCT)
GLUCOSE: 117 mg/dL — AB (ref 65–99)
HEMATOCRIT: 41 % (ref 36.0–46.0)
HEMOGLOBIN: 13.9 g/dL (ref 12.0–15.0)
Potassium: 3.8 mmol/L (ref 3.5–5.1)
SODIUM: 142 mmol/L (ref 135–145)

## 2017-02-08 LAB — PROTIME-INR
INR: 1.26
Prothrombin Time: 15.7 seconds — ABNORMAL HIGH (ref 11.4–15.2)

## 2017-02-08 LAB — GLUCOSE, CAPILLARY
GLUCOSE-CAPILLARY: 112 mg/dL — AB (ref 65–99)
Glucose-Capillary: 99 mg/dL (ref 65–99)

## 2017-02-08 LAB — ABO/RH: ABO/RH(D): A POS

## 2017-02-08 SURGERY — DILATATION & CURETTAGE/HYSTEROSCOPY WITH MYOSURE
Anesthesia: General | Wound class: Clean Contaminated

## 2017-02-08 MED ORDER — ONDANSETRON HCL 4 MG/2ML IJ SOLN
INTRAMUSCULAR | Status: AC
  Filled 2017-02-08: qty 2

## 2017-02-08 MED ORDER — DEXAMETHASONE SODIUM PHOSPHATE 10 MG/ML IJ SOLN
INTRAMUSCULAR | Status: AC
  Filled 2017-02-08: qty 1

## 2017-02-08 MED ORDER — MORPHINE SULFATE (PF) 4 MG/ML IV SOLN: 1.0000 mg | INTRAVENOUS | Status: DC | PRN

## 2017-02-08 MED ORDER — ACETAMINOPHEN 650 MG RE SUPP
650.0000 mg | RECTAL | Status: DC | PRN
  Filled 2017-02-08: qty 1

## 2017-02-08 MED ORDER — ACETAMINOPHEN 325 MG PO TABS: 650.0000 mg | ORAL_TABLET | ORAL | Status: DC | PRN

## 2017-02-08 MED ORDER — GLYCOPYRROLATE 0.2 MG/ML IJ SOLN
INTRAMUSCULAR | Status: DC | PRN
  Administered 2017-02-08: 0.2 mg via INTRAVENOUS

## 2017-02-08 MED ORDER — LIDOCAINE HCL (CARDIAC) 20 MG/ML IV SOLN
INTRAVENOUS | Status: DC | PRN
  Administered 2017-02-08: 100 mg via INTRAVENOUS

## 2017-02-08 MED ORDER — EPHEDRINE SULFATE 50 MG/ML IJ SOLN
INTRAMUSCULAR | Status: AC
  Filled 2017-02-08: qty 1

## 2017-02-08 MED ORDER — ACETAMINOPHEN NICU IV SYRINGE 10 MG/ML
INTRAVENOUS | Status: AC
  Filled 2017-02-08: qty 1

## 2017-02-08 MED ORDER — ONDANSETRON HCL 4 MG/2ML IJ SOLN: 4.0000 mg | Freq: Once | INTRAMUSCULAR | Status: DC | PRN

## 2017-02-08 MED ORDER — LIDOCAINE HCL (PF) 2 % IJ SOLN
INTRAMUSCULAR | Status: AC
  Filled 2017-02-08: qty 10

## 2017-02-08 MED ORDER — PHENYLEPHRINE HCL 10 MG/ML IJ SOLN
INTRAMUSCULAR | Status: DC | PRN
  Administered 2017-02-08: 200 ug via INTRAVENOUS
  Administered 2017-02-08 (×2): 100 ug via INTRAVENOUS

## 2017-02-08 MED ORDER — SODIUM CHLORIDE 0.9 % IV SOLN
INTRAVENOUS | Status: DC
  Administered 2017-02-08: 10:00:00 via INTRAVENOUS

## 2017-02-08 MED ORDER — PROPOFOL 10 MG/ML IV BOLUS
INTRAVENOUS | Status: AC
  Filled 2017-02-08: qty 20

## 2017-02-08 MED ORDER — FENTANYL CITRATE (PF) 100 MCG/2ML IJ SOLN: 25.0000 ug | INTRAMUSCULAR | Status: DC | PRN

## 2017-02-08 MED ORDER — FENTANYL CITRATE (PF) 100 MCG/2ML IJ SOLN
INTRAMUSCULAR | Status: AC
  Filled 2017-02-08: qty 2

## 2017-02-08 MED ORDER — DEXAMETHASONE SODIUM PHOSPHATE 10 MG/ML IJ SOLN
INTRAMUSCULAR | Status: DC | PRN
  Administered 2017-02-08: 8 mg via INTRAVENOUS

## 2017-02-08 MED ORDER — FENTANYL CITRATE (PF) 100 MCG/2ML IJ SOLN
INTRAMUSCULAR | Status: DC | PRN
  Administered 2017-02-08: 50 ug via INTRAVENOUS

## 2017-02-08 MED ORDER — GLYCOPYRROLATE 0.2 MG/ML IJ SOLN
INTRAMUSCULAR | Status: AC
Start: 2017-02-08 — End: 2017-02-08
  Filled 2017-02-08: qty 1

## 2017-02-08 MED ORDER — ONDANSETRON HCL 4 MG/2ML IJ SOLN
INTRAMUSCULAR | Status: DC | PRN
  Administered 2017-02-08: 4 mg via INTRAVENOUS

## 2017-02-08 MED ORDER — KETOROLAC TROMETHAMINE 30 MG/ML IJ SOLN
30.0000 mg | Freq: Four times a day (QID) | INTRAMUSCULAR | Status: DC
  Filled 2017-02-08: qty 1

## 2017-02-08 MED ORDER — PROPOFOL 10 MG/ML IV BOLUS
INTRAVENOUS | Status: DC | PRN
  Administered 2017-02-08: 50 mg via INTRAVENOUS
  Administered 2017-02-08: 100 mg via INTRAVENOUS

## 2017-02-08 SURGICAL SUPPLY — 24 items
ABLATOR ENDOMETRIAL MYOSURE (ABLATOR) IMPLANT
BAG INFUSER PRESSURE 100CC (MISCELLANEOUS) ×2 IMPLANT
CANISTER SUC SOCK COL 7IN (MISCELLANEOUS) ×2 IMPLANT
CATH ROBINSON RED A/P 16FR (CATHETERS) ×2 IMPLANT
DEVICE MYOSURE LITE (MISCELLANEOUS) IMPLANT
ELECT REM PT RETURN 9FT ADLT (ELECTROSURGICAL) ×2
ELECTRODE REM PT RTRN 9FT ADLT (ELECTROSURGICAL) ×1 IMPLANT
GLOVE BIOGEL M STER SZ 6 (GLOVE) ×2 IMPLANT
GLOVE BIOGEL PI IND STRL 6.5 (GLOVE) ×1 IMPLANT
GLOVE BIOGEL PI INDICATOR 6.5 (GLOVE) ×1
GLOVE PI ORTHOPRO 6.5 (GLOVE)
GLOVE PI ORTHOPRO STRL 6.5 (GLOVE) IMPLANT
GLOVE SURG SYN 6.5 ES PF (GLOVE) ×4 IMPLANT
GOWN STRL REUS W/ TWL LRG LVL3 (GOWN DISPOSABLE) ×2 IMPLANT
GOWN STRL REUS W/TWL LRG LVL3 (GOWN DISPOSABLE) ×2
KIT RM TURNOVER CYSTO AR (KITS) ×2 IMPLANT
PACK DNC HYST (MISCELLANEOUS) ×2 IMPLANT
PAD OB MATERNITY 4.3X12.25 (PERSONAL CARE ITEMS) ×2 IMPLANT
PAD PREP 24X41 OB/GYN DISP (PERSONAL CARE ITEMS) ×2 IMPLANT
SEAL ROD LENS SCOPE MYOSURE (ABLATOR) ×2 IMPLANT
SOL .9 NS 3000ML IRR  AL (IV SOLUTION) ×1
SOL .9 NS 3000ML IRR UROMATIC (IV SOLUTION) ×1 IMPLANT
TUBING CONNECTING 10 (TUBING) ×2 IMPLANT
TUBING HYSTEROSCOPY DOLPHIN (MISCELLANEOUS) IMPLANT

## 2017-02-08 NOTE — Discharge Instructions (Signed)
You should expect to have some cramping and vaginal bleeding for about a week. This should taper off and subside, much like a period. If heavy bleeding continues or gets worse, you should contact the office for an earlier appointment.   Please call the office or physician on call for fever >101, severe pain, and heavy bleeding.   [place]!!

## 2017-02-08 NOTE — Op Note (Signed)
Operative Report Hysteroscopy, Dilation and Curettage 02/08/2017  Patient:  Brandi Poole  71 y.o. female Preoperative diagnosis:  PMB Endometrial Polyp Postoperative diagnosis:  endometrial polyp, postmenopausal bleeding  PROCEDURE:  Procedure(s): DILATATION & CURETTAGE/HYSTEROSCOPY WITH MYOSURE (N/A) Surgeon:  Surgeon(s) and Role:    * Ward, Honor Loh, MD - Primary Anesthesia:  LMA I/O: Total I/O In: 300 [I.V.:300] Out: - no UOP, scant blood Specimens:  Endometrial curettings Complications: None Apparent Disposition:  VS stable to PACU  Findings: Uterus, mobile, normal size, sounding to 6.5 cm; normal cervix, vagina, perineum. Scallop-edged growth on the mons pubis around 10:00, stable from office visit.  Indication for procedure/Consents: 71 y.o.  here for scheduled surgery for the aforementioned diagnoses.  Risks of surgery were discussed with the patient including but not limited to: bleeding which may require transfusion; infection which may require antibiotics; injury to uterus or surrounding organs; need for additional procedures including laparotomy or laparoscopy; and other postoperative/anesthesia complications. Written informed consent was obtained.    Procedure Details:   The patient was then taken to the operating room where anesthesia was administered and was found to be adequate.  After a formal and adequate timeout was performed, she was placed in the dorsal lithotomy position and examined with the above findings. She was then prepped and draped in the sterile manner.  A speculum was then placed in the patient's vagina and a single tooth tenaculum was applied to the anterior lip of the cervix.    The uterus was sounded to 6.5cm. Her cervix was serially dilated to accommodate the myoscope, with findings as above. A sharp curettage was then performed until there was a gritty texture in all four quadrants. The specimen was handed off to nursing.  The camera was reinserted  and confirmed the uterus had been evacuated. The tenaculum was removed from the anterior lip of the cervix and the vaginal speculum was removed after noting good hemostasis. The patient tolerated the procedure well and was taken to the recovery area awake, extubated and in stable condition.  The patient will be discharged to home as per PACU criteria.  Routine postoperative instructions given. She will follow up in the clinic in two to four weeks for postoperative evaluation.  Larey Days, MD [facility] OBGYN Attending Gynecologist

## 2017-02-08 NOTE — H&P (Signed)
Chief Complaint:   Patient ID: Brandi Poole is a 71 y.o. female presenting with postmenopausal bleeding  HPI: She presented with first episode of PMB x 5 days   Ultrasound showed Some fluid at fundus of endometrial canal, total thickness of endometrial stripe was 4.37mm, but total 8.9 @ site of fluid. EMB results showed no hyperplasia or malignancy, but polyps  Past Medical History: has a past medical history of Anxiety, unspecified; Atrial fibrillation , unspecified (CMS-HCC); Depression, unspecified; Family history of diabetes mellitus; Fibrocystic breast disease; obesity (01/11/2014); Hyperlipidemia, unspecified; Hypertension; Obesity, unspecified; and Type 2 diabetes mellitus (CMS-HCC).  Past Surgical History: has a past surgical history that includes Endoscopic Carpal Tunnel Release (Right); knee surgery (Left, 1998); and wrist surgery (Right, 2006). Family History: family history includes Diabetes in her paternal grandmother; No Known Problems in her father and mother. Social History: reports that she has never smoked. She has never used smokeless tobacco. She reports that she does not drink alcohol or use drugs. OB/GYN History:  OB History  Gravida Para Term Preterm AB Living  4 4 4 4   SAB TAB Ectopic Molar Multiple Live Births  4    Allergies: has No Known Allergies. Medications:  Current Outpatient Prescriptions:  . atorvastatin (LIPITOR) 10 MG tablet, Take 1 tablet (10 mg total) by mouth once daily., Disp: 30 tablet, Rfl: 11 . diltiazem (DILT-XR) 240 MG XR capsule, Take 1 capsule (240 mg total) by mouth once daily., Disp: 90 capsule, Rfl: 3 . famotidine (PEPCID) 20 MG tablet, Take 1 tablet (20 mg total) by mouth once daily., Disp: 30 tablet, Rfl: 11 . metoprolol tartrate (LOPRESSOR) 50 MG tablet, TAKE ONE TABLET BY MOUTH TWICE DAILY, Disp: 180 tablet, Rfl: 1 . warfarin (COUMADIN) 2.5 MG tablet, Take 1 tablet (2.5 mg total) by mouth once daily., Disp: 30 tablet, Rfl:  5  Review of Systems: No SOB, no palpitations or chest pain, no new lower extremity edema, no nausea or vomiting or bowel or bladder complaints. See HPI for gyn specific ROS.  Exam:    BP (!) 153/86   Pulse 73   Temp 97.8 F (36.6 C) (Oral)   Resp 18   Ht 5\' 4"  (1.626 m)   Wt 89.8 kg (198 lb)   SpO2 99%   BMI 33.99 kg/m    General: Patient is well-groomed, well-nourished, appears stated age in no acute distress  HEENT: head is atraumatic and normocephalic, trachea is midline, neck is supple with no palpable nodules  CV: Regular rhythm and normal heart rate, no murmur  Pulm: Clear to auscultation throughout lung fields with no wheezing, crackles, or rhonchi. No increased work of breathing  Abdomen: soft , no mass, non-tender, no rebound tenderness, no hepatomegaly    Impression:  PMB (postmenopausal bleeding). with Endometrial polyp  Plan:   I encourage removal of polyp as by nature abnormal growth and requires surveillance.  We discussed D&C / Hysteroscopy and patient is agreeable to proceed.  The patient and I discussed the technical aspects of the procedure including the potential for risks and complications.These include but are not limited to the risk of infection requiring post-operative antibiotics or further procedures.We talked about the risk of injury to adjacent organs including bladder, bowel, ureter, blood vessels or nerves, the need to convert to an open incision, possibleneed for blood transfusion andpostop complications such asthromboembolic or cardiopulmonary complications.All of her questions were answered. Her preoperative exam was completed andthe appropriate consents were signed. She is scheduled to undergo  this procedure in the near future.

## 2017-02-08 NOTE — Anesthesia Preprocedure Evaluation (Addendum)
Anesthesia Evaluation  Patient identified by MRN, date of birth, ID band Patient awake    Reviewed: Allergy & Precautions, NPO status , Patient's Chart, lab work & pertinent test results, reviewed documented beta blocker date and time   Airway Mallampati: III  TM Distance: >3 FB     Dental  (+) Chipped, Upper Dentures, Missing   Pulmonary           Cardiovascular hypertension, Pt. on medications and Pt. on home beta blockers      Neuro/Psych PSYCHIATRIC DISORDERS Anxiety Depression    GI/Hepatic GERD  Controlled,  Endo/Other  diabetes, Type 2  Renal/GU      Musculoskeletal   Abdominal   Peds  Hematology   Anesthesia Other Findings   Reproductive/Obstetrics                            Anesthesia Physical Anesthesia Plan  ASA: III  Anesthesia Plan: General   Post-op Pain Management:    Induction: Intravenous  PONV Risk Score and Plan:   Airway Management Planned: LMA  Additional Equipment:   Intra-op Plan:   Post-operative Plan:   Informed Consent: I have reviewed the patients History and Physical, chart, labs and discussed the procedure including the risks, benefits and alternatives for the proposed anesthesia with the patient or authorized representative who has indicated his/her understanding and acceptance.     Plan Discussed with: CRNA  Anesthesia Plan Comments:         Anesthesia Quick Evaluation

## 2017-02-08 NOTE — Anesthesia Procedure Notes (Signed)
Procedure Name: LMA Insertion Date/Time: 02/08/2017 11:27 AM Performed by: Darlyne Russian Pre-anesthesia Checklist: Patient identified, Emergency Drugs available, Suction available, Patient being monitored and Timeout performed Patient Re-evaluated:Patient Re-evaluated prior to induction Oxygen Delivery Method: Circle system utilized Preoxygenation: Pre-oxygenation with 100% oxygen Induction Type: IV induction Ventilation: Mask ventilation without difficulty LMA: LMA inserted LMA Size: 3.5 Number of attempts: 2 Placement Confirmation: positive ETCO2 and breath sounds checked- equal and bilateral Tube secured with: Tape Dental Injury: Teeth and Oropharynx as per pre-operative assessment  Comments: LMA 4 placed initially but did not seat properly, changed to 3.5 "Air-Qsp" brand with easy placement and good seal.

## 2017-02-08 NOTE — Anesthesia Postprocedure Evaluation (Signed)
Anesthesia Post Note  Patient: Brandi Poole  Procedure(s) Performed: DILATATION & CURETTAGE/HYSTEROSCOPY WITH MYOSURE (N/A )  Patient location during evaluation: PACU Anesthesia Type: General Level of consciousness: awake and alert Pain management: pain level controlled Vital Signs Assessment: post-procedure vital signs reviewed and stable Respiratory status: spontaneous breathing, nonlabored ventilation, respiratory function stable and patient connected to nasal cannula oxygen Cardiovascular status: blood pressure returned to baseline and stable Postop Assessment: no apparent nausea or vomiting Anesthetic complications: no     Last Vitals:  Vitals:   02/08/17 1244 02/08/17 1259  BP: 119/74 129/65  Pulse: 72 71  Resp: 14   Temp:    SpO2: 95% 97%    Last Pain:  Vitals:   02/08/17 1209  TempSrc: Temporal                 Dresean Beckel S

## 2017-02-08 NOTE — Anesthesia Post-op Follow-up Note (Signed)
Anesthesia QCDR form completed.        

## 2017-02-08 NOTE — Transfer of Care (Signed)
Immediate Anesthesia Transfer of Care Note  Patient: Brandi Poole  Procedure(s) Performed: DILATATION & CURETTAGE/HYSTEROSCOPY WITH MYOSURE (N/A )  Patient Location: PACU  Anesthesia Type:General  Level of Consciousness: awake, alert  and oriented  Airway & Oxygen Therapy: Patient Spontanous Breathing and Patient connected to face mask oxygen  Post-op Assessment: Report given to RN and Post -op Vital signs reviewed and stable  Post vital signs: Reviewed and stable  Last Vitals:  Vitals:   02/08/17 0944 02/08/17 1209  BP: (!) 153/86 (!) 75/35  Pulse: 73 82  Resp: 18 14  Temp: 36.6 C (!) 36.2 C  SpO2: 99% 96%    Last Pain:  Vitals:   02/08/17 1209  TempSrc: Temporal         Complications: No apparent anesthesia complications

## 2017-02-14 LAB — SURGICAL PATHOLOGY

## 2017-02-27 ENCOUNTER — Other Ambulatory Visit: Payer: Self-pay | Admitting: Internal Medicine

## 2017-02-27 DIAGNOSIS — Z1231 Encounter for screening mammogram for malignant neoplasm of breast: Secondary | ICD-10-CM

## 2017-06-05 DIAGNOSIS — R600 Localized edema: Secondary | ICD-10-CM | POA: Insufficient documentation

## 2017-06-18 ENCOUNTER — Ambulatory Visit
Admission: RE | Admit: 2017-06-18 | Discharge: 2017-06-18 | Disposition: A | Discharge status: Home / Self Care | Payer: Medicare Other | Source: Ambulatory Visit | Attending: Internal Medicine | Admitting: Internal Medicine

## 2017-06-18 DIAGNOSIS — Z1231 Encounter for screening mammogram for malignant neoplasm of breast: Secondary | ICD-10-CM | POA: Diagnosis not present

## 2018-05-12 ENCOUNTER — Other Ambulatory Visit: Payer: Self-pay | Admitting: Internal Medicine

## 2018-05-12 DIAGNOSIS — Z1231 Encounter for screening mammogram for malignant neoplasm of breast: Secondary | ICD-10-CM

## 2018-05-19 DIAGNOSIS — I482 Chronic atrial fibrillation, unspecified: Secondary | ICD-10-CM | POA: Insufficient documentation

## 2018-06-19 ENCOUNTER — Ambulatory Visit
Admission: RE | Admit: 2018-06-19 | Discharge: 2018-06-19 | Disposition: A | Discharge status: Home / Self Care | Payer: Medicare Other | Source: Ambulatory Visit | Attending: Internal Medicine | Admitting: Internal Medicine

## 2018-06-19 DIAGNOSIS — Z1231 Encounter for screening mammogram for malignant neoplasm of breast: Secondary | ICD-10-CM

## 2018-11-07 DIAGNOSIS — R739 Hyperglycemia, unspecified: Secondary | ICD-10-CM | POA: Insufficient documentation

## 2019-05-12 ENCOUNTER — Other Ambulatory Visit: Payer: Self-pay | Admitting: Internal Medicine

## 2019-05-12 DIAGNOSIS — Z1231 Encounter for screening mammogram for malignant neoplasm of breast: Secondary | ICD-10-CM

## 2019-06-22 ENCOUNTER — Ambulatory Visit
Admission: RE | Admit: 2019-06-22 | Discharge: 2019-06-22 | Disposition: A | Discharge status: Home / Self Care | Payer: Medicare Other | Source: Ambulatory Visit | Attending: Internal Medicine | Admitting: Internal Medicine

## 2019-06-22 DIAGNOSIS — Z1231 Encounter for screening mammogram for malignant neoplasm of breast: Secondary | ICD-10-CM | POA: Diagnosis not present

## 2019-06-24 ENCOUNTER — Other Ambulatory Visit: Payer: Self-pay | Admitting: Internal Medicine

## 2019-06-24 DIAGNOSIS — R928 Other abnormal and inconclusive findings on diagnostic imaging of breast: Secondary | ICD-10-CM

## 2019-06-24 DIAGNOSIS — N6489 Other specified disorders of breast: Secondary | ICD-10-CM

## 2019-06-30 ENCOUNTER — Ambulatory Visit
Admission: RE | Admit: 2019-06-30 | Discharge: 2019-06-30 | Disposition: A | Discharge status: Home / Self Care | Payer: Medicare Other | Source: Ambulatory Visit | Attending: Internal Medicine | Admitting: Internal Medicine

## 2019-06-30 DIAGNOSIS — N6489 Other specified disorders of breast: Secondary | ICD-10-CM | POA: Insufficient documentation

## 2019-06-30 DIAGNOSIS — R928 Other abnormal and inconclusive findings on diagnostic imaging of breast: Secondary | ICD-10-CM

## 2020-06-24 ENCOUNTER — Other Ambulatory Visit: Payer: Self-pay | Admitting: Internal Medicine

## 2020-06-24 DIAGNOSIS — Z1231 Encounter for screening mammogram for malignant neoplasm of breast: Secondary | ICD-10-CM

## 2020-07-07 LAB — COLOGUARD: COLOGUARD: POSITIVE — AB

## 2020-08-22 ENCOUNTER — Other Ambulatory Visit: Payer: Self-pay

## 2020-08-22 ENCOUNTER — Ambulatory Visit
Admission: RE | Admit: 2020-08-22 | Discharge: 2020-08-22 | Disposition: A | Discharge status: Home / Self Care | Payer: Medicare Other | Source: Ambulatory Visit | Attending: Internal Medicine | Admitting: Internal Medicine

## 2020-08-22 DIAGNOSIS — Z1231 Encounter for screening mammogram for malignant neoplasm of breast: Secondary | ICD-10-CM | POA: Diagnosis not present

## 2021-01-13 DIAGNOSIS — H2513 Age-related nuclear cataract, bilateral: Secondary | ICD-10-CM | POA: Insufficient documentation

## 2021-03-13 ENCOUNTER — Encounter: Payer: Self-pay | Admitting: *Deleted

## 2021-03-14 ENCOUNTER — Ambulatory Visit
Admission: RE | Admit: 2021-03-14 | Discharge: 2021-03-14 | Disposition: A | Discharge status: Home / Self Care | Payer: Medicare Other | Attending: Gastroenterology | Admitting: Gastroenterology

## 2021-03-14 ENCOUNTER — Encounter: Payer: Self-pay | Admitting: *Deleted

## 2021-03-14 ENCOUNTER — Encounter
Admission: RE | Disposition: A | Discharge status: Home / Self Care | Payer: Self-pay | Source: Home / Self Care | Attending: Gastroenterology

## 2021-03-14 ENCOUNTER — Other Ambulatory Visit: Payer: Self-pay

## 2021-03-14 ENCOUNTER — Ambulatory Visit: Payer: Medicare Other | Admitting: Registered Nurse

## 2021-03-14 DIAGNOSIS — K5730 Diverticulosis of large intestine without perforation or abscess without bleeding: Secondary | ICD-10-CM | POA: Insufficient documentation

## 2021-03-14 DIAGNOSIS — D120 Benign neoplasm of cecum: Secondary | ICD-10-CM | POA: Diagnosis not present

## 2021-03-14 DIAGNOSIS — D123 Benign neoplasm of transverse colon: Secondary | ICD-10-CM | POA: Insufficient documentation

## 2021-03-14 DIAGNOSIS — D125 Benign neoplasm of sigmoid colon: Secondary | ICD-10-CM | POA: Insufficient documentation

## 2021-03-14 DIAGNOSIS — D124 Benign neoplasm of descending colon: Secondary | ICD-10-CM | POA: Diagnosis not present

## 2021-03-14 DIAGNOSIS — R195 Other fecal abnormalities: Secondary | ICD-10-CM | POA: Insufficient documentation

## 2021-03-14 DIAGNOSIS — K641 Second degree hemorrhoids: Secondary | ICD-10-CM | POA: Diagnosis not present

## 2021-03-14 HISTORY — DX: Diffuse cystic mastopathy of unspecified breast: N60.19

## 2021-03-14 HISTORY — DX: Malignant (primary) neoplasm, unspecified: C80.1

## 2021-03-14 HISTORY — PX: COLONOSCOPY WITH PROPOFOL: SHX5780

## 2021-03-14 HISTORY — DX: Obesity, unspecified: E66.9

## 2021-03-14 HISTORY — DX: Hyperlipidemia, unspecified: E78.5

## 2021-03-14 SURGERY — COLONOSCOPY WITH PROPOFOL
Anesthesia: General

## 2021-03-14 MED ORDER — METOPROLOL TARTRATE 5 MG/5ML IV SOLN
INTRAVENOUS | Status: DC | PRN
  Administered 2021-03-14 (×2): 1 mg via INTRAVENOUS

## 2021-03-14 MED ORDER — METOPROLOL TARTRATE 5 MG/5ML IV SOLN
INTRAVENOUS | Status: AC
  Filled 2021-03-14: qty 5

## 2021-03-14 MED ORDER — PROPOFOL 500 MG/50ML IV EMUL
INTRAVENOUS | Status: DC | PRN
  Administered 2021-03-14: 150 ug/kg/min via INTRAVENOUS

## 2021-03-14 MED ORDER — LIDOCAINE HCL (CARDIAC) PF 100 MG/5ML IV SOSY
PREFILLED_SYRINGE | INTRAVENOUS | Status: DC | PRN
  Administered 2021-03-14: 100 mg via INTRAVENOUS

## 2021-03-14 MED ORDER — SODIUM CHLORIDE 0.9 % IV SOLN: INTRAVENOUS | Status: DC

## 2021-03-14 MED ORDER — PROPOFOL 10 MG/ML IV BOLUS
INTRAVENOUS | Status: DC | PRN
  Administered 2021-03-14: 80 mg via INTRAVENOUS

## 2021-03-14 NOTE — Anesthesia Postprocedure Evaluation (Signed)
Anesthesia Post Note  Patient: Brandi Poole  Procedure(s) Performed: COLONOSCOPY WITH PROPOFOL  Patient location during evaluation: Phase II Anesthesia Type: General Level of consciousness: awake and alert, awake and oriented Pain management: pain level controlled Vital Signs Assessment: post-procedure vital signs reviewed and stable Respiratory status: spontaneous breathing, nonlabored ventilation and respiratory function stable Cardiovascular status: blood pressure returned to baseline and stable Postop Assessment: no apparent nausea or vomiting Anesthetic complications: no   No notable events documented.   Last Vitals:  Vitals:   03/14/21 1252 03/14/21 1305  BP: 124/81 (!) 151/80  Pulse: (!) 37   Resp: 20   Temp:    SpO2: 97% 92%    Last Pain:  Vitals:   03/14/21 1305  TempSrc:   PainSc: 0-No pain                 Phill Mutter

## 2021-03-14 NOTE — Transfer of Care (Signed)
Immediate Anesthesia Transfer of Care Note  Patient: Brandi Poole  Procedure(s) Performed: COLONOSCOPY WITH PROPOFOL  Patient Location: Endoscopy Unit  Anesthesia Type:General  Level of Consciousness: drowsy  Airway & Oxygen Therapy: Patient Spontanous Breathing and Patient connected to nasal cannula oxygen  Post-op Assessment: Report given to RN and Post -op Vital signs reviewed and stable  Post vital signs: Reviewed and stable  Last Vitals:  Vitals Value Taken Time  BP    Temp    Pulse 58 03/14/21 1242  Resp 17 03/14/21 1242  SpO2 89 % 03/14/21 1242  Vitals shown include unvalidated device data.  Last Pain:  Vitals:   03/14/21 1131  TempSrc: Temporal         Complications: No notable events documented.

## 2021-03-14 NOTE — H&P (Signed)
Outpatient short stay form Pre-procedure 03/14/2021  Brandi Rubenstein, MD  Primary Physician: [name], MD  Reason for visit:  Positive cologuard  History of present illness:   75 y/o lady with history of a. Fib on coumadin here for colonoscopy for positive FIT test. Never had colonoscopy or stool based screening before. Last dose of coumadin was 6 days ago. No family history of Gi malignancies. No blood thinners. No significant abdominal surgeries.    Current Facility-Administered Medications:    0.9 %  sodium chloride infusion, , Intravenous, Continuous, Tahisha Hakim, Hilton Cork, MD, Last Rate: 20 mL/hr at 03/14/21 1152, New Bag at 03/14/21 1152  Medications Prior to Admission  Medication Sig Dispense Refill Last Dose   diltiazem (DILACOR XR) 240 MG 24 hr capsule Take 240 mg by mouth every morning.    03/13/2021   atorvastatin (LIPITOR) 10 MG tablet Take 10 mg by mouth every morning.       famotidine (PEPCID) 20 MG tablet Take 20 mg by mouth every morning.       metoprolol tartrate (LOPRESSOR) 50 MG tablet Take 2 tablets (100 mg total) by mouth 2 (two) times daily. 60 tablet 0    Tetrahydrozoline HCl (VISINE OP) Place 1 drop into both eyes as needed (for dry eyes).      warfarin (COUMADIN) 2.5 MG tablet Take 2.5 mg by mouth daily.   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    Physical Exam  Gen: Alert, oriented. Appears stated age.  HEENT: PERRLA. Lungs: No respiratory distress CV: RRR Abd: soft, benign, no masses Ext: No edema    Planned procedures: Proceed with colonoscopy. The patient  understands the nature of the planned procedure, indications, risks, alternatives and potential complications including but not limited to bleeding, infection, perforation, damage to internal organs and possible oversedation/side effects from anesthesia. The patient agrees and gives consent to proceed.  Please refer to procedure notes for findings, recommendations and patient disposition/instructions.     Brandi Rubenstein, MD [facility]

## 2021-03-14 NOTE — Op Note (Signed)
[facility] Patient Name: Brandi Poole Procedure Date: 03/14/2021 12:06 PM MRN: [id number] Account #: [id number] Date of Birth: 11-11-1945 Admit Type: Outpatient Age: 75 Room: [facility] ENDO ROOM 1 Gender: Female Note Status: Finalized Instrument Name: Jasper Riling [id number] Procedure:             Colonoscopy Indications:           Positive fecal immunochemical test Providers:             Andrey Farmer MD, MD Medicines:             Monitored Anesthesia Care Complications:         No immediate complications. Estimated blood loss:                         Minimal. Procedure:             Pre-Anesthesia Assessment:                        - Prior to the procedure, a History and Physical was                         performed, and patient medications and allergies were                         reviewed. The patient is competent. The risks and                         benefits of the procedure and the sedation options and                         risks were discussed with the patient. All questions                         were answered and informed consent was obtained.                         Patient identification and proposed procedure were                         verified by the physician, the nurse, the anesthetist                         and the technician in the endoscopy suite. Mental                         Status Examination: alert and oriented. Airway                         Examination: normal oropharyngeal airway and neck                         mobility. Respiratory Examination: clear to                         auscultation. CV Examination: normal. Prophylactic                         Antibiotics: The patient does not require prophylactic  antibiotics. Prior Anticoagulants: The patient has                         taken Coumadin (warfarin), last dose was 6 days prior                         to procedure. ASA Grade  Assessment: II - A patient                         with mild systemic disease. After reviewing the risks                         and benefits, the patient was deemed in satisfactory                         condition to undergo the procedure. The anesthesia                         plan was to use monitored anesthesia care (MAC).                         Immediately prior to administration of medications,                         the patient was re-assessed for adequacy to receive                         sedatives. The heart rate, respiratory rate, oxygen                         saturations, blood pressure, adequacy of pulmonary                         ventilation, and response to care were monitored                         throughout the procedure. The physical status of the                         patient was re-assessed after the procedure.                        After obtaining informed consent, the colonoscope was                         passed under direct vision. Throughout the procedure,                         the patient's blood pressure, pulse, and oxygen                         saturations were monitored continuously. The                         Colonoscope was introduced through the anus and                         advanced to the the terminal ileum. The colonoscopy  was performed without difficulty. The patient                         tolerated the procedure well. The quality of the bowel                         preparation was good. Findings:      The perianal and digital rectal examinations were normal.      The terminal ileum appeared normal.      A 9 mm polyp was found in the cecum. The polyp was sessile. The polyp       was removed with a cold snare. Resection and retrieval were complete. To       prevent bleeding after the polypectomy, two hemostatic clips were       successfully placed. There was no bleeding at the end of the procedure.      A 3 mm  polyp was found in the hepatic flexure. The polyp was sessile.       The polyp was removed with a cold snare. Resection and retrieval were       complete. Estimated blood loss was minimal.      A 1 mm polyp was found in the hepatic flexure. The polyp was sessile.       The polyp was removed with a jumbo cold forceps. Resection and retrieval       were complete. Estimated blood loss was minimal.      Two sessile polyps were found in the transverse colon. The polyps were 2       to 3 mm in size. These polyps were removed with a cold snare. Resection       and retrieval were complete. Estimated blood loss was minimal.      A 2 mm polyp was found in the descending colon. The polyp was sessile.       The polyp was removed with a jumbo cold forceps. Resection and retrieval       were complete. Estimated blood loss was minimal.      A 3 mm polyp was found in the sigmoid colon. The polyp was sessile. The       polyp was removed with a cold snare. Resection and retrieval were       complete. Estimated blood loss was minimal.      Many small and large-mouthed diverticula were found in the sigmoid colon       and descending colon.      Internal hemorrhoids were found during retroflexion. The hemorrhoids       were Grade II (internal hemorrhoids that prolapse but reduce       spontaneously).      The exam was otherwise without abnormality on direct and retroflexion       views. Impression:            - The examined portion of the ileum was normal.                        - One 9 mm polyp in the cecum, removed with a cold                         snare. Resected and retrieved. Clips were placed.                        -  One 3 mm polyp at the hepatic flexure, removed with                         a cold snare. Resected and retrieved.                        - One 1 mm polyp at the hepatic flexure, removed with                         a jumbo cold forceps. Resected and retrieved.                        -  Two 2 to 3 mm polyps in the transverse colon,                         removed with a cold snare. Resected and retrieved.                        - One 2 mm polyp in the descending colon, removed with                         a jumbo cold forceps. Resected and retrieved.                        - One 3 mm polyp in the sigmoid colon, removed with a                         cold snare. Resected and retrieved.                        - Diverticulosis in the sigmoid colon and in the                         descending colon.                        - Internal hemorrhoids.                        - The examination was otherwise normal on direct and                         retroflexion views. Recommendation:        - Discharge patient to home.                        - Resume previous diet.                        - Resume Coumadin (warfarin) at prior dose tomorrow.                         Refer to managing physician for further adjustment of                         therapy.                        - Await pathology results.                        -  Repeat colonoscopy in 3 years for surveillance.                        - Return to referring physician as previously                         scheduled. Procedure Code(s):     --- Professional ---                        [phone], Colonoscopy, flexible; with removal of                         tumor(s), polyp(s), or other lesion(s) by snare                         technique                        45380, 63, Colonoscopy, flexible; with biopsy, single                         or multiple Diagnosis Code(s):     --- Professional ---                        K64.1, Second degree hemorrhoids                        K63.5, Polyp of colon                        R19.5, Other fecal abnormalities                        K57.30, Diverticulosis of large intestine without                         perforation or abscess without bleeding CPT copyright 2019 American Medical  Association. All rights reserved. The codes documented in this report are preliminary and upon coder review may  be revised to meet current compliance requirements. Andrey Farmer MD, MD 03/14/2021 12:45:28 PM Number of Addenda: 0 Note Initiated On: 03/14/2021 12:06 PM Scope Withdrawal Time: 0 hours 18 minutes 16 seconds  Total Procedure Duration: 0 hours 23 minutes 55 seconds  Estimated Blood Loss:  Estimated blood loss was minimal.      [facility]

## 2021-03-14 NOTE — Interval H&P Note (Signed)
History and Physical Interval Note:  03/14/2021 12:08 PM  Brandi Poole  has presented today for surgery, with the diagnosis of +COLOGUARD.  The various methods of treatment have been discussed with the patient and family. After consideration of risks, benefits and other options for treatment, the patient has consented to  Procedure(s): COLONOSCOPY WITH PROPOFOL (N/A) as a surgical intervention.  The patient's history has been reviewed, patient examined, no change in status, stable for surgery.  I have reviewed the patient's chart and labs.  Questions were answered to the patient's satisfaction.     [name]  Ok to proceed with colonoscopy

## 2021-03-14 NOTE — Anesthesia Preprocedure Evaluation (Addendum)
Anesthesia Evaluation  Patient identified by MRN, date of birth, ID band Patient awake    Reviewed: Allergy & Precautions, NPO status , Patient's Chart, lab work & pertinent test results  Airway Mallampati: II  TM Distance: >3 FB Neck ROM: Full    Dental  (+) Partial Lower, Edentulous Lower, Edentulous Upper, Poor Dentition   Pulmonary neg pulmonary ROS,    Pulmonary exam normal        Cardiovascular hypertension, Pt. on medications + dysrhythmias Atrial Fibrillation  Rhythm:Irregular     Neuro/Psych PSYCHIATRIC DISORDERS Anxiety Depression negative neurological ROS     GI/Hepatic Neg liver ROS, Bowel prep,GERD  Medicated,  Endo/Other  negative endocrine ROSdiabetes  Renal/GU negative Renal ROS  negative genitourinary   Musculoskeletal negative musculoskeletal ROS (+)   Abdominal   Peds negative pediatric ROS (+)  Hematology negative hematology ROS (+)   Anesthesia Other Findings . Anxiety  . Atrial fibrillation (CMS-HCC)  . Depression  . Family history of diabetes mellitus  . Fibrocystic breast disease  . History of cancer  Basal Cell on nose  . Hx of obesity 01/11/2014  . Hyperlipidemia  . Hypertension  . Obesity  . Type 2 diabetes mellitus (CMS-HCC)     Reproductive/Obstetrics negative OB ROS                            Anesthesia Physical Anesthesia Plan  ASA: 3  Anesthesia Plan: General   Post-op Pain Management:    Induction: Intravenous  PONV Risk Score and Plan: 2 and Propofol infusion and TIVA  Airway Management Planned: Natural Airway and Nasal Cannula  Additional Equipment:   Intra-op Plan:   Post-operative Plan:   Informed Consent: I have reviewed the patients History and Physical, chart, labs and discussed the procedure including the risks, benefits and alternatives for the proposed anesthesia with the patient or authorized representative who has  indicated his/her understanding and acceptance.       Plan Discussed with: CRNA, Anesthesiologist and Surgeon  Anesthesia Plan Comments:         Anesthesia Quick Evaluation

## 2021-03-15 ENCOUNTER — Encounter: Payer: Self-pay | Admitting: Gastroenterology

## 2021-03-15 LAB — SURGICAL PATHOLOGY

## 2021-04-28 ENCOUNTER — Other Ambulatory Visit: Payer: Self-pay

## 2021-04-28 ENCOUNTER — Emergency Department: Payer: Medicare Other

## 2021-04-28 ENCOUNTER — Emergency Department
Admission: EM | Admit: 2021-04-28 | Discharge: 2021-04-28 | Disposition: A | Discharge status: Home / Self Care | Payer: Medicare Other | Attending: Emergency Medicine | Admitting: Emergency Medicine

## 2021-04-28 DIAGNOSIS — I10 Essential (primary) hypertension: Secondary | ICD-10-CM | POA: Insufficient documentation

## 2021-04-28 DIAGNOSIS — R2243 Localized swelling, mass and lump, lower limb, bilateral: Secondary | ICD-10-CM | POA: Insufficient documentation

## 2021-04-28 DIAGNOSIS — Z8522 Personal history of malignant neoplasm of nasal cavities, middle ear, and accessory sinuses: Secondary | ICD-10-CM | POA: Insufficient documentation

## 2021-04-28 DIAGNOSIS — Z7901 Long term (current) use of anticoagulants: Secondary | ICD-10-CM | POA: Diagnosis not present

## 2021-04-28 DIAGNOSIS — L249 Irritant contact dermatitis, unspecified cause: Secondary | ICD-10-CM | POA: Insufficient documentation

## 2021-04-28 DIAGNOSIS — R600 Localized edema: Secondary | ICD-10-CM

## 2021-04-28 DIAGNOSIS — Z79899 Other long term (current) drug therapy: Secondary | ICD-10-CM | POA: Insufficient documentation

## 2021-04-28 DIAGNOSIS — E119 Type 2 diabetes mellitus without complications: Secondary | ICD-10-CM | POA: Insufficient documentation

## 2021-04-28 DIAGNOSIS — R21 Rash and other nonspecific skin eruption: Secondary | ICD-10-CM | POA: Insufficient documentation

## 2021-04-28 LAB — COMPREHENSIVE METABOLIC PANEL
ALT: 17 U/L (ref 0–44)
AST: 27 U/L (ref 15–41)
Albumin: 4 g/dL (ref 3.5–5.0)
Alkaline Phosphatase: 95 U/L (ref 38–126)
Anion gap: 9 (ref 5–15)
BUN: 10 mg/dL (ref 8–23)
CO2: 26 mmol/L (ref 22–32)
Calcium: 9.5 mg/dL (ref 8.9–10.3)
Chloride: 105 mmol/L (ref 98–111)
Creatinine, Ser: 0.73 mg/dL (ref 0.44–1.00)
GFR, Estimated: >60 mL/min (ref >60)
Glucose, Bld: 129 mg/dL — ABNORMAL HIGH (ref 70–99)
Potassium: 3.9 mmol/L (ref 3.5–5.1)
Sodium: 140 mmol/L (ref 135–145)
Total Bilirubin: 1.8 mg/dL — ABNORMAL HIGH (ref 0.3–1.2)
Total Protein: 7.8 g/dL (ref 6.5–8.1)

## 2021-04-28 LAB — CBC WITH DIFFERENTIAL/PLATELET
Abs Immature Granulocytes: 0.02 10*3/uL (ref 0.00–0.07)
Basophils Absolute: 0.1 10*3/uL (ref 0.0–0.1)
Basophils Relative: 1 %
Eosinophils Absolute: 0.4 10*3/uL (ref 0.0–0.5)
Eosinophils Relative: 6 %
HCT: 45.2 % (ref 36.0–46.0)
Hemoglobin: 14.9 g/dL (ref 12.0–15.0)
Immature Granulocytes: 0 %
Lymphocytes Relative: 33 %
Lymphs Abs: 2.5 10*3/uL (ref 0.7–4.0)
MCH: 29.9 pg (ref 26.0–34.0)
MCHC: 33 g/dL (ref 30.0–36.0)
MCV: 90.8 fL (ref 80.0–100.0)
Monocytes Absolute: 0.6 10*3/uL (ref 0.1–1.0)
Monocytes Relative: 8 %
Neutro Abs: 4 10*3/uL (ref 1.7–7.7)
Neutrophils Relative %: 52 %
Platelets: 255 10*3/uL (ref 150–400)
RBC: 4.98 MIL/uL (ref 3.87–5.11)
RDW: 14.5 % (ref 11.5–15.5)
WBC: 7.6 10*3/uL (ref 4.0–10.5)
nRBC: 0 % (ref 0.0–0.2)

## 2021-04-28 LAB — PROTIME-INR
INR: 1.9 — ABNORMAL HIGH (ref 0.8–1.2)
Prothrombin Time: 21.9 seconds — ABNORMAL HIGH (ref 11.4–15.2)

## 2021-04-28 MED ORDER — FUROSEMIDE 20 MG PO TABS: 20.0000 mg | ORAL_TABLET | Freq: Every day | ORAL | 11 refills | Status: DC

## 2021-04-28 MED ORDER — HYDROCORTISONE 0.5 % EX CREA: 1.0000 "application " | TOPICAL_CREAM | Freq: Two times a day (BID) | CUTANEOUS | 0 refills | Status: AC

## 2021-04-28 NOTE — ED Provider Notes (Signed)
[facility] Emergency Department Provider Note   ____________________________________________   Event Date/Time   First MD Initiated Contact with Patient 04/28/21 1545     (approximate)  I have reviewed the triage vital signs and the nursing notes.   HISTORY  Chief Complaint Leg Swelling and Rash    HPI Brandi Poole is a 75 y.o. female who presents for bilateral lower extremity swelling and itching  LOCATION: Bilateral lower extremities from ankle to knee DURATION: 2 days prior to arrival TIMING: Mildly worsening over the first 24 hours and then stable since onset SEVERITY: Moderate QUALITY: Swelling and itching CONTEXT: Patient states that she was outdoors walking 2 days ago and that evening began having swelling, redness, and itching to bilateral lower extremities with associated red rash. MODIFYING FACTORS: Denies any exacerbating or relieving factors ASSOCIATED SYMPTOMS: Bilateral lower extremity itching, bilateral lower extremity rash   Per medical record review, patient has history of atrial fibrillation, hypertension, and hyperlipidemia          Past Medical History:  Diagnosis Date   Anxiety    Atrial fibrillation ([place])    Cancer ([place])    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   Depression    Diabetes mellitus without complication ([place])    NO MEDS-DIET CONTROLLED   Fibrocystic breast disease    GERD (gastroesophageal reflux disease)    HLD (hyperlipidemia)    Hypertension    Obesity     There are no problems to display for this patient.   Past Surgical History:  Procedure Laterality Date   BREAST CYST ASPIRATION     CARPAL TUNNEL RELEASE     COLONOSCOPY WITH PROPOFOL N/A 03/14/2021   Procedure: COLONOSCOPY WITH PROPOFOL;  Surgeon: [name], MD;  Location: [facility] ENDOSCOPY;  Service: Endoscopy;  Laterality: N/A;   DILATATION & CURETTAGE/HYSTEROSCOPY WITH MYOSURE N/A 02/08/2017   Procedure: [place];  Surgeon: Ward, Honor Loh, MD;  Location: [facility] ORS;  Service: Gynecology;  Laterality: N/A;   FRACTURE SURGERY     BROKEN WRIST RIGHT AND BROKEN KNEE LEFT   KNEE SURGERY     removal on basal cell cancer      Prior to Admission medications   Medication Sig Start Date End Date Taking? Authorizing Provider  furosemide (LASIX) 20 MG tablet Take 1 tablet (20 mg total) by mouth daily. 04/28/21 04/28/22 Yes Naaman Plummer, MD  hydrocortisone cream 0.5 % Apply 1 application topically 2 (two) times daily. To bilateral lower extremities 04/28/21  Yes Naaman Plummer, MD  atorvastatin (LIPITOR) 10 MG tablet Take 10 mg by mouth every morning.  01/29/17 01/29/18  [provider]  diltiazem (DILACOR XR) 240 MG 24 hr capsule Take 240 mg by mouth every morning.  01/24/16   [provider]  famotidine (PEPCID) 20 MG tablet Take 20 mg by mouth every morning.  01/29/17 01/29/18  [provider]  metoprolol tartrate (LOPRESSOR) 50 MG tablet Take 2 tablets (100 mg total) by mouth 2 (two) times daily. 01/17/17 01/17/18  Eula Listen, MD  Tetrahydrozoline HCl (VISINE OP) Place 1 drop into both eyes as needed (for dry eyes).    [provider]  warfarin (COUMADIN) 2.5 MG tablet Take 2.5 mg by mouth daily. 01/23/17 01/23/18  [provider]    Allergies Patient has no known allergies.  Family History  Problem Relation Age of Onset   Breast cancer Neg Hx     Social History Social History  Tobacco Use   Smoking status: Never   Smokeless tobacco: Never  Vaping Use   Vaping Use: Never used  Substance Use Topics   Alcohol use: No   Drug use: No    Review of Systems Constitutional: No fever/chills Eyes: No visual changes. ENT: No sore throat. Cardiovascular: Denies chest pain. Respiratory: Denies shortness of breath. Gastrointestinal: No abdominal pain.  No nausea, no vomiting.  No diarrhea. Genitourinary: Negative for  dysuria. Musculoskeletal: Negative for acute arthralgias Skin: Positive for bilateral lower extremity edema and red itchy rash Neurological: Negative for headaches, weakness/numbness/paresthesias in any extremity Psychiatric: Negative for suicidal ideation/homicidal ideation ____________________________________________  PHYSICAL EXAM:  VITAL SIGNS: ED Triage Vitals  Enc Vitals Group     BP 04/28/21 1401 135/86     Pulse Rate 04/28/21 1257 (!) 110     Resp 04/28/21 1257 20     Temp 04/28/21 1257 98.2 F (36.8 C)     Temp Source 04/28/21 1257 Oral     SpO2 04/28/21 1257 98 %     Weight 04/28/21 1257 215 lb (97.5 kg)     Height 04/28/21 1257 5\' 3"  (1.6 m)     Head Circumference --      Peak Flow --      Pain Score 04/28/21 1321 0     Pain Loc --      Pain Edu? --      Excl. in [place]? --    Constitutional: Alert and oriented. Well appearing and in no acute distress. Eyes: Conjunctivae are normal. PERRL. Head: Atraumatic. Nose: No congestion/rhinnorhea. Mouth/Throat: Mucous membranes are moist. Neck: No stridor Cardiovascular: Grossly normal heart sounds.  Good peripheral circulation. Respiratory: Normal respiratory effort.  No retractions. Gastrointestinal: Soft and nontender. No distention. Musculoskeletal: No obvious deformities Neurologic:  Normal speech and language. No gross focal neurologic deficits are appreciated. Skin: Pitting edema to bilateral lower extremities below the knee.  There is a red maculopapular rash circumferentially over bilateral lower extremities from the ankle to the knee that has areas of excoriation.  No skin cracking or leaking appreciated.  Skin is warm and dry.  Psychiatric: Mood and affect are normal. Speech and behavior are normal.  ____________________________________________   LABS (all labs ordered are listed, but only abnormal results are displayed)  Labs Reviewed  COMPREHENSIVE METABOLIC PANEL - Abnormal; Notable for the following  components:      Result Value   Glucose, Bld 129 (*)    Total Bilirubin 1.8 (*)    All other components within normal limits  PROTIME-INR - Abnormal; Notable for the following components:   Prothrombin Time 21.9 (*)    INR 1.9 (*)    All other components within normal limits  CBC WITH DIFFERENTIAL/PLATELET   RADIOLOGY  ED MD interpretation: DVT ultrasound of bilateral lower extremities shows no evidence of acute abnormalities including no appreciated DVT  Official radiology report(s): US Venous Img Lower Bilateral  Result Date: 04/28/2021 CLINICAL DATA:  Bilateral lower extremity edema. EXAM: Bilateral LOWER EXTREMITY VENOUS DOPPLER ULTRASOUND TECHNIQUE: Gray-scale sonography with compression, as well as color and duplex ultrasound, were performed to evaluate the deep venous system(s) from the level of the common femoral vein through the popliteal and proximal calf veins. COMPARISON:  None FINDINGS: VENOUS Normal compressibility of the common femoral, superficial femoral, and popliteal veins, as well as the visualized calf veins. Visualized portions of profunda femoral vein and great saphenous vein unremarkable. No filling defects to suggest DVT on grayscale or color  Doppler imaging. Doppler waveforms show normal direction of venous flow, normal respiratory plasticity and response to augmentation. OTHER None. Limitations: Limited assessment of calf veins, specifically posterior tibial and peroneal vein on the LEFT and peroneal vein on the RIGHT seen only on color Doppler in these areas, not visualized on grayscale due to edema. IMPRESSION: Negative for DVT in the bilateral lower extremities. Limited assessment of calf veins as outlined above seen only on color Doppler insert areas. Electronically Signed   By: Zetta Bills M.D.   On: 04/28/2021 14:27    ____________________________________________   PROCEDURES  Procedure(s) performed (including Critical  Care):  Procedures   ____________________________________________   INITIAL IMPRESSION / ASSESSMENT AND PLAN / ED COURSE  As part of my medical decision making, I reviewed the following data within the electronic medical record, if available:  Nursing notes reviewed and incorporated, Labs reviewed, EKG interpreted, Old chart reviewed, Radiograph reviewed and Notes from prior ED visits reviewed and incorporated      Patient is non-toxic appearing and well hydrated. Ddx: Patients symptoms not typical for other emergent causes of rash such as cellulitis, abscess, necrotizing fasciitis, vasculitis, anaphylaxis, SJS or TENS. As patient's rash developed after walking outside and with significant pruritus, concern for possible contact dermatitis.  However, cannot rule out possible venous congestion secondary to patient's persistent A. fib.  Therefore will provide prescriptions for 3-day course of Lasix as well as hydrocortisone cream to control itching. Disposition: Patient will be discharged with strict return precautions and follow up with pediatrician within 24-48 hours for further evaluation.      ____________________________________________   FINAL CLINICAL IMPRESSION(S) / ED DIAGNOSES  Final diagnoses:  Bilateral lower extremity edema  Rash and nonspecific skin eruption  Irritant contact dermatitis, unspecified trigger     ED Discharge Orders          Ordered    furosemide (LASIX) 20 MG tablet  Daily        04/28/21 1606    hydrocortisone cream 0.5 %  2 times daily        04/28/21 1606             Note:  This document was prepared using Dragon voice recognition software and may include unintentional dictation errors.    Naaman Plummer, MD 04/28/21 [phone]

## 2021-04-28 NOTE — ED Provider Notes (Signed)
Emergency Medicine Provider Triage Evaluation Note  Brandi Poole , a 75 y.o. female  was evaluated in triage.  Pt complains of bilateral lower extremity swelling and redness. Symptoms started 2 days ago, but upon awakening this morning it was much worse. No history of CHF.  Review of Systems  Positive: Bilateral LE swelling Negative: Fever, pain  Physical Exam  Pulse (!) 110    Temp 98.2 F (36.8 C) (Oral)    Resp 20    Ht 5\' 3"  (1.6 m)    Wt 97.5 kg    SpO2 98%    BMI 38.09 kg/m  Gen:   Awake, no distress   Resp:  Normal effort  MSK:   Moves extremities without difficulty  Other:    Medical Decision Making  Medically screening exam initiated at 1:19 PM.  Appropriate orders placed.  Brandi Poole was informed that the remainder of the evaluation will be completed by another provider, this initial triage assessment does not replace that evaluation, and the importance of remaining in the ED until their evaluation is complete.   Victorino Dike, FNP 04/28/21 1323    Harvest Dark, MD 04/28/21 1506

## 2021-04-28 NOTE — ED Triage Notes (Signed)
Pt c/o increasing BLE swelling x 2 days.  Itchy rash and noted to BLEs.  Denies pain.

## 2021-07-19 ENCOUNTER — Other Ambulatory Visit: Payer: Self-pay | Admitting: Internal Medicine

## 2021-07-19 DIAGNOSIS — Z1231 Encounter for screening mammogram for malignant neoplasm of breast: Secondary | ICD-10-CM

## 2021-08-24 ENCOUNTER — Ambulatory Visit
Admission: RE | Admit: 2021-08-24 | Discharge: 2021-08-24 | Disposition: A | Discharge status: Home / Self Care | Payer: Medicare Other | Source: Ambulatory Visit | Attending: Internal Medicine | Admitting: Internal Medicine

## 2021-08-24 DIAGNOSIS — Z1231 Encounter for screening mammogram for malignant neoplasm of breast: Secondary | ICD-10-CM | POA: Insufficient documentation

## 2022-01-08 DIAGNOSIS — R7303 Prediabetes: Secondary | ICD-10-CM | POA: Insufficient documentation

## 2022-03-25 DIAGNOSIS — I4891 Unspecified atrial fibrillation: Secondary | ICD-10-CM | POA: Insufficient documentation

## 2022-03-25 DIAGNOSIS — I10 Essential (primary) hypertension: Secondary | ICD-10-CM | POA: Insufficient documentation

## 2022-03-25 DIAGNOSIS — I890 Lymphedema, not elsewhere classified: Secondary | ICD-10-CM | POA: Insufficient documentation

## 2022-03-25 DIAGNOSIS — E785 Hyperlipidemia, unspecified: Secondary | ICD-10-CM | POA: Insufficient documentation

## 2022-03-25 NOTE — Progress Notes (Signed)
MRN : [id number]  Brandi Poole is a 76 y.o. (May 18, 1945) female who presents with chief complaint of legs swell.  History of Present Illness:   Patient is seen for evaluation of leg pain and leg swelling.  She needs knee surgery but her orthopedic surgeon has sent her here to treat her venous insufficiency and her draining wounds before they can move forward with orthopedic procedures.  The patient first noticed the swelling remotely. The swelling is associated with pain and discoloration. The pain and swelling worsens with prolonged dependency and improves with elevation. The pain is unrelated to activity.  The patient notes that in the morning the legs are significantly improved but they steadily worsened throughout the course of the day. The patient also notes a steady worsening of the discoloration in the ankle and shin area.   The patient denies claudication symptoms.  The patient denies symptoms consistent with rest pain.  The patient denies and extensive history of DJD and LS spine disease.  The patient has no had any past angiography, interventions or vascular surgery.  Elevation makes the leg symptoms better, dependency makes them much worse. There is no history of ulcerations. The patient denies any recent changes in medications.  The patient has not been wearing graduated compression on a regular basis.  The patient denies a history of DVT or PE. There is no prior history of phlebitis. There is no history of primary lymphedema.  No history of malignancies. No history of trauma or groin or pelvic surgery. There is no history of radiation treatment to the groin or pelvis  The patient denies amaurosis fugax or recent TIA symptoms. There are no recent neurological changes noted. The patient denies recent episodes of angina or shortness of breath.  No outpatient medications have been marked as taking for the 03/26/22 encounter (Appointment) with Delana Meyer, Dolores Lory, MD.     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   Depression    Diabetes mellitus without complication ([place])    NO MEDS-DIET CONTROLLED   Fibrocystic breast disease    GERD (gastroesophageal reflux disease)    HLD (hyperlipidemia)    Hypertension    Obesity     Past Surgical History:  Procedure Laterality Date   BREAST CYST ASPIRATION     CARPAL TUNNEL RELEASE     COLONOSCOPY WITH PROPOFOL N/A 03/14/2021   Procedure: COLONOSCOPY WITH PROPOFOL;  Surgeon: [name], MD;  Location: [facility] ENDOSCOPY;  Service: Endoscopy;  Laterality: N/A;   DILATATION & CURETTAGE/HYSTEROSCOPY WITH MYOSURE N/A 02/08/2017   Procedure: [place];  Surgeon: Ward, Honor Loh, MD;  Location: [facility] ORS;  Service: Gynecology;  Laterality: N/A;   FRACTURE SURGERY     BROKEN WRIST RIGHT AND BROKEN KNEE LEFT   KNEE SURGERY     removal on basal cell cancer      Social History Social History   Tobacco Use   Smoking status: Never   Smokeless tobacco: Never  Vaping Use   Vaping Use: Never used  Substance Use Topics   Alcohol use: No   Drug use: No    Family History Family History  Problem Relation Age of Onset   Breast cancer Neg Hx     No Known Allergies   REVIEW OF SYSTEMS (Negative unless checked)  Constitutional: '[]'$ Weight loss  '[]'$ Fever  '[]'$ Chills Cardiac: '[]'$   Chest pain   '[]'$ Chest pressure   '[]'$ Palpitations   '[]'$ Shortness of breath when laying flat   '[]'$ Shortness of breath with exertion. Vascular:  '[]'$ Pain in legs with walking   '[x]'$ Pain in legs with standing  '[]'$ History of DVT   '[]'$ Phlebitis   '[x]'$ Swelling in legs   '[]'$ Varicose veins   '[]'$ Non-healing ulcers Pulmonary:   '[]'$ Uses home oxygen   '[]'$ Productive cough   '[]'$ Hemoptysis   '[]'$ Wheeze  '[]'$ COPD   '[]'$ Asthma Neurologic:  '[]'$ Dizziness   '[]'$ Seizures   '[]'$ History of stroke   '[]'$ History of TIA  '[]'$ Aphasia   '[]'$ Vissual changes   '[]'$ Weakness or numbness  in arm   '[]'$ Weakness or numbness in leg Musculoskeletal:   '[]'$ Joint swelling   '[x]'$ Joint pain   '[]'$ Low back pain Hematologic:  '[]'$ Easy bruising  '[]'$ Easy bleeding   '[]'$ Hypercoagulable state   '[]'$ Anemic Gastrointestinal:  '[]'$ Diarrhea   '[]'$ Vomiting  '[]'$ Gastroesophageal reflux/heartburn   '[]'$ Difficulty swallowing. Genitourinary:  '[]'$ Chronic kidney disease   '[]'$ Difficult urination  '[]'$ Frequent urination   '[]'$ Blood in urine Skin:  '[]'$ Rashes   '[]'$ Ulcers  Psychological:  '[]'$ History of anxiety   '[]'$  History of major depression.  Physical Examination  There were no vitals filed for this visit. There is no height or weight on file to calculate BMI. Gen: WD/WN, NAD Head: [place]/AT, No temporalis wasting.  Ear/Nose/Throat: Hearing grossly intact, nares w/o erythema or drainage, pinna without lesions Eyes: PER, EOMI, sclera nonicteric.  Neck: Supple, no gross masses.  No JVD.  Pulmonary:  Good air movement, no audible wheezing, no use of accessory muscles.  Cardiac: RRR, precordium not hyperdynamic. Vascular:  scattered varicosities present bilaterally.  Moderate to severe venous stasis changes to the legs bilaterally.  Two superficial ulcers noted on the left draining clear straw colored fluid.  3-4+ soft pitting edema, CEAP [id number]  Vessel Right Left  Radial Palpable Palpable  Gastrointestinal: soft, non-distended. No guarding/no peritoneal signs.  Musculoskeletal: M/S 5/5 throughout.  No deformity.  Neurologic: CN 2-12 intact. Pain and light touch intact in extremities.  Symmetrical.  Speech is fluent. Motor exam as listed above. Psychiatric: Judgment intact, Mood & affect appropriate for pt's clinical situation. Dermatologic: Venous rashes no ulcers noted.  No changes consistent with cellulitis. Lymph : No lichenification or skin changes of chronic lymphedema.  CBC Lab Results  Component Value Date   WBC 7.6 04/28/2021   HGB 14.9 04/28/2021   HCT 45.2 04/28/2021   MCV 90.8 04/28/2021   PLT 255 04/28/2021     BMET    Component Value Date/Time   NA 140 04/28/2021 1259   K 3.9 04/28/2021 1259   CL 105 04/28/2021 1259   CO2 26 04/28/2021 1259   GLUCOSE 129 (H) 04/28/2021 1259   BUN 10 04/28/2021 1259   CREATININE 0.73 04/28/2021 1259   CALCIUM 9.5 04/28/2021 1259   GFRNONAA >60 04/28/2021 1259   GFRAA >60 01/17/2017 1730   CrCl cannot be calculated (Patient's most recent lab result is older than the maximum 21 days allowed.).  COAG Lab Results  Component Value Date   INR 1.9 (H) 04/28/2021   INR 1.26 02/08/2017   INR 1.23 01/17/2017    Radiology No results found.   Assessment/Plan 1. Venous ulcer of ankle, left (HCC) No surgery or intervention at this point in time.    I have had a long discussion with the patient regarding venous insufficiency and why it  causes symptoms, specifically venous ulceration. I have discussed with the patient the chronic skin changes that accompany venous insufficiency  and the long term sequela such as infection and recurring  ulceration.  Patient will be placed in [employer] which will be changed weekly drainage permitting.  In addition, behavioral modification including several periods of elevation of the lower extremities during the day will be continued. Achieving a position with the ankles at heart level was stressed to the patient  The patient is instructed to begin routine exercise, especially walking on a daily basis  In the future the patient can be assessed for graduated compression stockings or wraps as well as a Lymph Pump once the ulcers are healed. - VAS [place] LOWER EXTREMITY VENOUS (DVT); Future  2. Chronic venous insufficiency No surgery or intervention at this point in time.    I have had a long discussion with the patient regarding venous insufficiency and why it  causes symptoms, specifically venous ulceration. I have discussed with the patient the chronic skin changes that accompany venous insufficiency and the long term sequela  such as infection and recurring  ulceration.  Patient will be placed in [employer] which will be changed weekly drainage permitting.  In addition, behavioral modification including several periods of elevation of the lower extremities during the day will be continued. Achieving a position with the ankles at heart level was stressed to the patient  The patient is instructed to begin routine exercise, especially walking on a daily basis  In the future the patient can be assessed for graduated compression stockings or wraps as well as a Lymph Pump once the ulcers are healed. - VAS [place] LOWER EXTREMITY VENOUS (DVT); Future  3. Lymphedema Recommend:  I have had a long discussion with the patient regarding swelling and why it  causes symptoms.  Patient will begin wearing graduated compression on a daily basis a prescription was given. The patient will  wear the stockings first thing in the morning and removing them in the evening. The patient is instructed specifically not to sleep in the stockings.   In addition, behavioral modification will be initiated.  This will include frequent elevation, use of over the counter pain medications and exercise such as walking.  Consideration for a lymph pump will also be made based upon the effectiveness of conservative therapy.  This would help to improve the edema control and prevent sequela such as ulcers and infections   Patient should undergo duplex ultrasound of the venous system to ensure that DVT or reflux is not present.  The patient will follow-up with me after the ultrasound.  - VAS [place] LOWER EXTREMITY VENOUS (DVT); Future  4. Essential hypertension Continue antihypertensive medications as already ordered, these medications have been reviewed and there are no changes at this time.  5. Longstanding persistent atrial fibrillation ([place]) Continue antiarrhythmia medications as already ordered, these medications have been reviewed and there are no changes  at this time.  Continue anticoagulation as ordered by Cardiology Service  6. Mixed hyperlipidemia Continue statin as ordered and reviewed, no changes at this time    Hortencia Pilar, MD  03/25/2022 3:35 PM

## 2022-03-26 ENCOUNTER — Encounter (INDEPENDENT_AMBULATORY_CARE_PROVIDER_SITE_OTHER): Payer: Self-pay | Admitting: Vascular Surgery

## 2022-03-26 ENCOUNTER — Ambulatory Visit (INDEPENDENT_AMBULATORY_CARE_PROVIDER_SITE_OTHER): Payer: 59 | Admitting: Vascular Surgery

## 2022-03-26 VITALS — BP 154/74 | HR 72 | Resp 18 | Ht 63.0 in | Wt 224.0 lb

## 2022-03-26 DIAGNOSIS — L97929 Non-pressure chronic ulcer of unspecified part of left lower leg with unspecified severity: Secondary | ICD-10-CM | POA: Diagnosis not present

## 2022-03-26 DIAGNOSIS — I4811 Longstanding persistent atrial fibrillation: Secondary | ICD-10-CM

## 2022-03-26 DIAGNOSIS — I83023 Varicose veins of left lower extremity with ulcer of ankle: Secondary | ICD-10-CM | POA: Diagnosis not present

## 2022-03-26 DIAGNOSIS — E782 Mixed hyperlipidemia: Secondary | ICD-10-CM

## 2022-03-26 DIAGNOSIS — I10 Essential (primary) hypertension: Secondary | ICD-10-CM | POA: Diagnosis not present

## 2022-03-26 DIAGNOSIS — I890 Lymphedema, not elsewhere classified: Secondary | ICD-10-CM | POA: Diagnosis not present

## 2022-03-26 DIAGNOSIS — I83029 Varicose veins of left lower extremity with ulcer of unspecified site: Secondary | ICD-10-CM | POA: Diagnosis not present

## 2022-03-26 DIAGNOSIS — I872 Venous insufficiency (chronic) (peripheral): Secondary | ICD-10-CM | POA: Diagnosis not present

## 2022-03-26 DIAGNOSIS — L97329 Non-pressure chronic ulcer of left ankle with unspecified severity: Secondary | ICD-10-CM

## 2022-04-02 ENCOUNTER — Ambulatory Visit (INDEPENDENT_AMBULATORY_CARE_PROVIDER_SITE_OTHER): Payer: 59

## 2022-04-02 ENCOUNTER — Other Ambulatory Visit (INDEPENDENT_AMBULATORY_CARE_PROVIDER_SITE_OTHER): Payer: Self-pay | Admitting: Vascular Surgery

## 2022-04-02 DIAGNOSIS — I10 Essential (primary) hypertension: Secondary | ICD-10-CM

## 2022-04-02 DIAGNOSIS — L97329 Non-pressure chronic ulcer of left ankle with unspecified severity: Secondary | ICD-10-CM

## 2022-04-02 DIAGNOSIS — I890 Lymphedema, not elsewhere classified: Secondary | ICD-10-CM

## 2022-04-02 DIAGNOSIS — I83023 Varicose veins of left lower extremity with ulcer of ankle: Secondary | ICD-10-CM

## 2022-04-02 DIAGNOSIS — I4811 Longstanding persistent atrial fibrillation: Secondary | ICD-10-CM

## 2022-04-02 DIAGNOSIS — I872 Venous insufficiency (chronic) (peripheral): Secondary | ICD-10-CM

## 2022-04-02 DIAGNOSIS — E782 Mixed hyperlipidemia: Secondary | ICD-10-CM

## 2022-04-11 NOTE — Progress Notes (Unsigned)
MRN : [id number]  Brandi Poole is a 76 y.o. (04-06-1946) female who presents with chief complaint of legs hurt and swell.  History of Present Illness:   Patient is seen for follow up evaluation of leg pain and leg swelling.  She needs knee surgery but her orthopedic surgeon has sent her here to treat her venous insufficiency and her draining wounds before they can move forward with orthopedic procedures.  The patient first noticed the swelling remotely. The swelling is associated with pain and discoloration. The pain and swelling worsens with prolonged dependency and improves with elevation. The pain is unrelated to activity.  The patient notes that in the morning the legs are significantly improved but they steadily worsened throughout the course of the day. The patient also notes a steady worsening of the discoloration in the ankle and shin area.    The patient denies claudication symptoms.  The patient denies symptoms consistent with rest pain.  The patient denies and extensive history of DJD and LS spine disease.  The patient has no had any past angiography, interventions or vascular surgery.   Elevation makes the leg symptoms better, dependency makes them much worse. There is no history of ulcerations. The patient denies any recent changes in medications.  No outpatient medications have been marked as taking for the 04/12/22 encounter (Appointment) with Delana Meyer, Dolores Lory, MD.    Past Medical History:  Diagnosis Date   Anxiety    Atrial fibrillation ([place])    Cancer ([place])    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   Depression    Diabetes mellitus without complication ([place])    NO MEDS-DIET CONTROLLED   Fibrocystic breast disease    GERD (gastroesophageal reflux disease)    HLD (hyperlipidemia)    Hypertension    Obesity     Past Surgical History:  Procedure Laterality Date   BREAST CYST ASPIRATION     CARPAL TUNNEL RELEASE     COLONOSCOPY WITH PROPOFOL N/A 03/14/2021    Procedure: COLONOSCOPY WITH PROPOFOL;  Surgeon: [name], MD;  Location: [facility] ENDOSCOPY;  Service: Endoscopy;  Laterality: N/A;   DILATATION & CURETTAGE/HYSTEROSCOPY WITH MYOSURE N/A 02/08/2017   Procedure: [place];  Surgeon: Ward, Honor Loh, MD;  Location: [facility] ORS;  Service: Gynecology;  Laterality: N/A;   FRACTURE SURGERY     BROKEN WRIST RIGHT AND BROKEN KNEE LEFT   KNEE SURGERY     removal on basal cell cancer      Social History Social History   Tobacco Use   Smoking status: Never   Smokeless tobacco: Never  Vaping Use   Vaping Use: Never used  Substance Use Topics   Alcohol use: No   Drug use: No    Family History Family History  Problem Relation Age of Onset   Breast cancer Neg Hx     No Known Allergies   REVIEW OF SYSTEMS (Negative unless checked)  Constitutional: '[]'$ Weight loss  '[]'$ Fever  '[]'$ Chills Cardiac: '[]'$ Chest pain   '[]'$ Chest pressure   '[]'$ Palpitations   '[]'$ Shortness of breath when laying flat   '[]'$ Shortness of breath with exertion. Vascular:  '[]'$ Pain in legs with walking   '[x]'$ Pain in legs at rest  '[]'$ History of DVT   '[]'$ Phlebitis   '[x]'$ Swelling in legs   '[]'$ Varicose veins   '[]'$ Non-healing ulcers Pulmonary:   '[]'$ Uses home oxygen   '[]'$ Productive cough   '[]'$ Hemoptysis   '[]'$ Wheeze  '[]'$ COPD   '[]'$   Asthma Neurologic:  '[]'$ Dizziness   '[]'$ Seizures   '[]'$ History of stroke   '[]'$ History of TIA  '[]'$ Aphasia   '[]'$ Vissual changes   '[]'$ Weakness or numbness in arm   '[]'$ Weakness or numbness in leg Musculoskeletal:   '[]'$ Joint swelling   '[]'$ Joint pain   '[]'$ Low back pain Hematologic:  '[]'$ Easy bruising  '[]'$ Easy bleeding   '[]'$ Hypercoagulable state   '[]'$ Anemic Gastrointestinal:  '[]'$ Diarrhea   '[]'$ Vomiting  '[]'$ Gastroesophageal reflux/heartburn   '[]'$ Difficulty swallowing. Genitourinary:  '[]'$ Chronic kidney disease   '[]'$ Difficult urination  '[]'$ Frequent urination   '[]'$ Blood in urine Skin:  '[]'$ Rashes   '[]'$ Ulcers  Psychological:  '[]'$ History of anxiety   '[]'$  History of major  depression.  Physical Examination  There were no vitals filed for this visit. There is no height or weight on file to calculate BMI. Gen: WD/WN, NAD Head: [place]/AT, No temporalis wasting.  Ear/Nose/Throat: Hearing grossly intact, nares w/o erythema or drainage, pinna without lesions Eyes: PER, EOMI, sclera nonicteric.  Neck: Supple, no gross masses.  No JVD.  Pulmonary:  Good air movement, no audible wheezing, no use of accessory muscles.  Cardiac: RRR, precordium not hyperdynamic. Vascular:  scattered varicosities present bilaterally.  Moderate venous stasis changes to the legs bilaterally.  2+ soft pitting edema  Vessel Right Left  Radial Palpable Palpable  Gastrointestinal: soft, non-distended. No guarding/no peritoneal signs.  Musculoskeletal: M/S 5/5 throughout.  No deformity.  Neurologic: CN 2-12 intact. Pain and light touch intact in extremities.  Symmetrical.  Speech is fluent. Motor exam as listed above. Psychiatric: Judgment intact, Mood & affect appropriate for pt's clinical situation. Dermatologic: Venous rashes no ulcers noted.  No changes consistent with cellulitis. Lymph : No lichenification or skin changes of chronic lymphedema.  CBC Lab Results  Component Value Date   WBC 7.6 04/28/2021   HGB 14.9 04/28/2021   HCT 45.2 04/28/2021   MCV 90.8 04/28/2021   PLT 255 04/28/2021    BMET    Component Value Date/Time   NA 140 04/28/2021 1259   K 3.9 04/28/2021 1259   CL 105 04/28/2021 1259   CO2 26 04/28/2021 1259   GLUCOSE 129 (H) 04/28/2021 1259   BUN 10 04/28/2021 1259   CREATININE 0.73 04/28/2021 1259   CALCIUM 9.5 04/28/2021 1259   GFRNONAA >60 04/28/2021 1259   GFRAA >60 01/17/2017 1730   CrCl cannot be calculated (Patient's most recent lab result is older than the maximum 21 days allowed.).  COAG Lab Results  Component Value Date   INR 1.9 (H) 04/28/2021   INR 1.26 02/08/2017   INR 1.23 01/17/2017    Radiology VAS [place] LOWER EXTREMITY VENOUS  REFLUX  Result Date: 04/05/2022  Lower Venous Reflux Study Patient Name:  Brandi Poole  Date of Exam:   04/02/2022 Medical Rec #: [id number]          Accession #:    [id number] Date of Birth: 1945/08/18          Patient Gender: F Patient Age:   3 years Exam Location:  [facility] Procedure:      VAS [place] LOWER EXTREMITY VENOUS REFLUX Referring Phys: Hortencia Pilar --------------------------------------------------------------------------------  Indications: Edema, left, and ulceration.  Performing Technologist: Concha Norway RVT  Examination Guidelines: A complete evaluation includes B-mode imaging, spectral Doppler, color Doppler, and power Doppler as needed of all accessible portions of each vessel. Bilateral testing is considered an integral part of a complete examination. Limited examinations for reoccurring indications may be performed as noted. The reflux portion of  the exam is performed with the patient in reverse Trendelenburg. Significant venous reflux is defined as >500 ms in the superficial venous system, and >1 second in the deep venous system.   Summary: Left: - No evidence of deep vein thrombosis seen in the left lower extremity, from the common femoral through the popliteal veins. - No evidence of superficial venous thrombosis in the left lower extremity. - There is no evidence of venous reflux seen in the left lower extremity. - No evidence of superficial venous reflux seen in the left greater saphenous vein. - No evidence of superficial venous reflux seen in the left short saphenous vein.  *See table(s) above for measurements and observations. Electronically signed by Hortencia Pilar MD on 04/05/2022 at 12:27:01 PM.    Final      Assessment/Plan There are no diagnoses linked to this encounter.   Hortencia Pilar, MD  04/11/2022 4:50 PM

## 2022-04-12 ENCOUNTER — Encounter (INDEPENDENT_AMBULATORY_CARE_PROVIDER_SITE_OTHER): Payer: Self-pay | Admitting: Vascular Surgery

## 2022-04-12 ENCOUNTER — Ambulatory Visit (INDEPENDENT_AMBULATORY_CARE_PROVIDER_SITE_OTHER): Payer: 59 | Admitting: Vascular Surgery

## 2022-04-12 VITALS — BP 158/90 | HR 85 | Resp 16 | Ht 63.0 in | Wt 220.0 lb

## 2022-04-12 DIAGNOSIS — I4811 Longstanding persistent atrial fibrillation: Secondary | ICD-10-CM | POA: Diagnosis not present

## 2022-04-12 DIAGNOSIS — I872 Venous insufficiency (chronic) (peripheral): Secondary | ICD-10-CM | POA: Diagnosis not present

## 2022-04-12 DIAGNOSIS — I10 Essential (primary) hypertension: Secondary | ICD-10-CM | POA: Diagnosis not present

## 2022-04-12 DIAGNOSIS — I890 Lymphedema, not elsewhere classified: Secondary | ICD-10-CM

## 2022-04-12 DIAGNOSIS — Z833 Family history of diabetes mellitus: Secondary | ICD-10-CM | POA: Insufficient documentation

## 2022-04-12 DIAGNOSIS — E669 Obesity, unspecified: Secondary | ICD-10-CM | POA: Insufficient documentation

## 2022-04-12 DIAGNOSIS — N950 Postmenopausal bleeding: Secondary | ICD-10-CM | POA: Insufficient documentation

## 2022-04-12 DIAGNOSIS — E782 Mixed hyperlipidemia: Secondary | ICD-10-CM

## 2022-04-12 DIAGNOSIS — N6019 Diffuse cystic mastopathy of unspecified breast: Secondary | ICD-10-CM | POA: Insufficient documentation

## 2022-04-12 DIAGNOSIS — F32A Depression, unspecified: Secondary | ICD-10-CM | POA: Insufficient documentation

## 2022-04-12 DIAGNOSIS — F419 Anxiety disorder, unspecified: Secondary | ICD-10-CM | POA: Insufficient documentation

## 2022-06-18 ENCOUNTER — Ambulatory Visit (INDEPENDENT_AMBULATORY_CARE_PROVIDER_SITE_OTHER): Payer: 59 | Admitting: Vascular Surgery

## 2022-07-31 ENCOUNTER — Other Ambulatory Visit: Payer: Self-pay | Admitting: Internal Medicine

## 2022-07-31 ENCOUNTER — Other Ambulatory Visit: Payer: Self-pay | Admitting: Student

## 2022-07-31 DIAGNOSIS — Z1231 Encounter for screening mammogram for malignant neoplasm of breast: Secondary | ICD-10-CM

## 2022-08-27 ENCOUNTER — Ambulatory Visit
Admission: RE | Admit: 2022-08-27 | Discharge: 2022-08-27 | Disposition: A | Discharge status: Home / Self Care | Payer: 59 | Source: Ambulatory Visit | Attending: Student | Admitting: Student

## 2022-08-27 DIAGNOSIS — Z1231 Encounter for screening mammogram for malignant neoplasm of breast: Secondary | ICD-10-CM | POA: Insufficient documentation

## 2022-09-03 ENCOUNTER — Encounter: Payer: Self-pay | Admitting: Intensive Care

## 2022-09-03 ENCOUNTER — Inpatient Hospital Stay
Admission: EM | Admit: 2022-09-03 | Discharge: 2022-09-07 | DRG: 603 | Disposition: A | Discharge status: Home / Self Care | Payer: 59 | Attending: Internal Medicine | Admitting: Internal Medicine

## 2022-09-03 ENCOUNTER — Other Ambulatory Visit: Payer: Self-pay

## 2022-09-03 ENCOUNTER — Emergency Department: Payer: 59

## 2022-09-03 DIAGNOSIS — K219 Gastro-esophageal reflux disease without esophagitis: Secondary | ICD-10-CM | POA: Diagnosis present

## 2022-09-03 DIAGNOSIS — I83023 Varicose veins of left lower extremity with ulcer of ankle: Secondary | ICD-10-CM | POA: Diagnosis present

## 2022-09-03 DIAGNOSIS — B9689 Other specified bacterial agents as the cause of diseases classified elsewhere: Secondary | ICD-10-CM | POA: Diagnosis present

## 2022-09-03 DIAGNOSIS — E785 Hyperlipidemia, unspecified: Secondary | ICD-10-CM | POA: Diagnosis present

## 2022-09-03 DIAGNOSIS — Z6838 Body mass index (BMI) 38.0-38.9, adult: Secondary | ICD-10-CM

## 2022-09-03 DIAGNOSIS — R6520 Severe sepsis without septic shock: Secondary | ICD-10-CM | POA: Diagnosis present

## 2022-09-03 DIAGNOSIS — I872 Venous insufficiency (chronic) (peripheral): Secondary | ICD-10-CM | POA: Diagnosis present

## 2022-09-03 DIAGNOSIS — Z79899 Other long term (current) drug therapy: Secondary | ICD-10-CM

## 2022-09-03 DIAGNOSIS — I4891 Unspecified atrial fibrillation: Secondary | ICD-10-CM | POA: Diagnosis present

## 2022-09-03 DIAGNOSIS — L97329 Non-pressure chronic ulcer of left ankle with unspecified severity: Secondary | ICD-10-CM | POA: Diagnosis present

## 2022-09-03 DIAGNOSIS — I482 Chronic atrial fibrillation, unspecified: Secondary | ICD-10-CM | POA: Diagnosis present

## 2022-09-03 DIAGNOSIS — I480 Paroxysmal atrial fibrillation: Secondary | ICD-10-CM | POA: Diagnosis present

## 2022-09-03 DIAGNOSIS — E669 Obesity, unspecified: Secondary | ICD-10-CM | POA: Diagnosis present

## 2022-09-03 DIAGNOSIS — S81802A Unspecified open wound, left lower leg, initial encounter: Secondary | ICD-10-CM

## 2022-09-03 DIAGNOSIS — F32A Depression, unspecified: Secondary | ICD-10-CM | POA: Diagnosis present

## 2022-09-03 DIAGNOSIS — E119 Type 2 diabetes mellitus without complications: Secondary | ICD-10-CM | POA: Diagnosis present

## 2022-09-03 DIAGNOSIS — A280 Pasteurellosis: Secondary | ICD-10-CM | POA: Diagnosis not present

## 2022-09-03 DIAGNOSIS — I10 Essential (primary) hypertension: Secondary | ICD-10-CM | POA: Diagnosis present

## 2022-09-03 DIAGNOSIS — L03116 Cellulitis of left lower limb: Secondary | ICD-10-CM | POA: Diagnosis not present

## 2022-09-03 DIAGNOSIS — R7881 Bacteremia: Secondary | ICD-10-CM | POA: Insufficient documentation

## 2022-09-03 DIAGNOSIS — A419 Sepsis, unspecified organism: Secondary | ICD-10-CM | POA: Diagnosis present

## 2022-09-03 DIAGNOSIS — L03119 Cellulitis of unspecified part of limb: Secondary | ICD-10-CM | POA: Diagnosis present

## 2022-09-03 DIAGNOSIS — Z85828 Personal history of other malignant neoplasm of skin: Secondary | ICD-10-CM

## 2022-09-03 DIAGNOSIS — E872 Acidosis, unspecified: Secondary | ICD-10-CM | POA: Diagnosis present

## 2022-09-03 DIAGNOSIS — Z7901 Long term (current) use of anticoagulants: Secondary | ICD-10-CM

## 2022-09-03 LAB — CBC WITH DIFFERENTIAL/PLATELET
Abs Immature Granulocytes: 0.03 10*3/uL (ref 0.00–0.07)
Basophils Absolute: 0.1 10*3/uL (ref 0.0–0.1)
Basophils Relative: 0 %
Eosinophils Absolute: 0 10*3/uL (ref 0.0–0.5)
Eosinophils Relative: 0 %
HCT: 41.8 % (ref 36.0–46.0)
Hemoglobin: 14.1 g/dL (ref 12.0–15.0)
Immature Granulocytes: 0 %
Lymphocytes Relative: 6 %
Lymphs Abs: 0.8 10*3/uL (ref 0.7–4.0)
MCH: 30 pg (ref 26.0–34.0)
MCHC: 33.7 g/dL (ref 30.0–36.0)
MCV: 88.9 fL (ref 80.0–100.0)
Monocytes Absolute: 0.4 10*3/uL (ref 0.1–1.0)
Monocytes Relative: 3 %
Neutro Abs: 11.4 10*3/uL — ABNORMAL HIGH (ref 1.7–7.7)
Neutrophils Relative %: 91 %
Platelets: 208 10*3/uL (ref 150–400)
RBC: 4.7 MIL/uL (ref 3.87–5.11)
RDW: 14.6 % (ref 11.5–15.5)
WBC: 12.6 10*3/uL — ABNORMAL HIGH (ref 4.0–10.5)
nRBC: 0 % (ref 0.0–0.2)

## 2022-09-03 LAB — COMPREHENSIVE METABOLIC PANEL
ALT: 19 U/L (ref 0–44)
AST: 37 U/L (ref 15–41)
Albumin: 3.6 g/dL (ref 3.5–5.0)
Alkaline Phosphatase: 73 U/L (ref 38–126)
Anion gap: 12 (ref 5–15)
BUN: 15 mg/dL (ref 8–23)
CO2: 21 mmol/L — ABNORMAL LOW (ref 22–32)
Calcium: 8.8 mg/dL — ABNORMAL LOW (ref 8.9–10.3)
Chloride: 104 mmol/L (ref 98–111)
Creatinine, Ser: 0.8 mg/dL (ref 0.44–1.00)
GFR, Estimated: >60 mL/min (ref >60)
Glucose, Bld: 147 mg/dL — ABNORMAL HIGH (ref 70–99)
Potassium: 3.8 mmol/L (ref 3.5–5.1)
Sodium: 137 mmol/L (ref 135–145)
Total Bilirubin: 2 mg/dL — ABNORMAL HIGH (ref 0.3–1.2)
Total Protein: 7 g/dL (ref 6.5–8.1)

## 2022-09-03 LAB — URINALYSIS, ROUTINE W REFLEX MICROSCOPIC
Bilirubin Urine: NEGATIVE
Glucose, UA: NEGATIVE mg/dL
Hgb urine dipstick: NEGATIVE
Ketones, ur: 5 mg/dL — AB
Nitrite: NEGATIVE
Protein, ur: 30 mg/dL — AB
Specific Gravity, Urine: 1.029 (ref 1.005–1.030)
pH: 5 (ref 5.0–8.0)

## 2022-09-03 LAB — LACTIC ACID, PLASMA
Lactic Acid, Venous: 2 mmol/L (ref 0.5–1.9)
Lactic Acid, Venous: 2.3 mmol/L (ref 0.5–1.9)

## 2022-09-03 LAB — PROTIME-INR
INR: 2 — ABNORMAL HIGH (ref 0.8–1.2)
Prothrombin Time: 22.2 seconds — ABNORMAL HIGH (ref 11.4–15.2)

## 2022-09-03 MED ORDER — SODIUM CHLORIDE 0.9 % IV SOLN
2.0000 g | INTRAVENOUS | Status: DC
  Administered 2022-09-04: 2 g via INTRAVENOUS
  Filled 2022-09-03: qty 2

## 2022-09-03 MED ORDER — VANCOMYCIN HCL 2000 MG/400ML IV SOLN
2000.0000 mg | Freq: Once | INTRAVENOUS | Status: AC
  Administered 2022-09-03: 2000 mg via INTRAVENOUS
  Filled 2022-09-03: qty 400

## 2022-09-03 MED ORDER — WARFARIN SODIUM 4 MG PO TABS
4.0000 mg | ORAL_TABLET | Freq: Once | ORAL | Status: AC
  Administered 2022-09-03: 4 mg via ORAL
  Filled 2022-09-03: qty 1

## 2022-09-03 MED ORDER — ONDANSETRON HCL 4 MG PO TABS
4.0000 mg | ORAL_TABLET | Freq: Four times a day (QID) | ORAL | Status: DC | PRN
  Administered 2022-09-05: 4 mg via ORAL
  Filled 2022-09-03: qty 1

## 2022-09-03 MED ORDER — SODIUM CHLORIDE 0.9 % IV BOLUS
1000.0000 mL | Freq: Once | INTRAVENOUS | Status: AC
Start: 2022-09-03 — End: 2022-09-03
  Administered 2022-09-03: 1000 mL via INTRAVENOUS

## 2022-09-03 MED ORDER — SODIUM CHLORIDE 0.9 % IV SOLN
2.0000 g | Freq: Once | INTRAVENOUS | Status: AC
  Administered 2022-09-03: 2 g via INTRAVENOUS
  Filled 2022-09-03: qty 20

## 2022-09-03 MED ORDER — FAMOTIDINE 20 MG PO TABS
20.0000 mg | ORAL_TABLET | ORAL | Status: DC
  Administered 2022-09-04 – 2022-09-07 (×4): 20 mg via ORAL
  Filled 2022-09-03 (×4): qty 1

## 2022-09-03 MED ORDER — ONDANSETRON HCL 4 MG/2ML IJ SOLN: 4.0000 mg | Freq: Four times a day (QID) | INTRAMUSCULAR | Status: DC | PRN

## 2022-09-03 MED ORDER — LACTATED RINGERS IV SOLN: INTRAVENOUS | Status: DC

## 2022-09-03 MED ORDER — HYDRALAZINE HCL 20 MG/ML IJ SOLN: 10.0000 mg | INTRAMUSCULAR | Status: DC | PRN

## 2022-09-03 MED ORDER — ALBUTEROL SULFATE (2.5 MG/3ML) 0.083% IN NEBU: 2.5000 mg | INHALATION_SOLUTION | RESPIRATORY_TRACT | Status: DC | PRN

## 2022-09-03 MED ORDER — VANCOMYCIN HCL IN DEXTROSE 1-5 GM/200ML-% IV SOLN
1000.0000 mg | INTRAVENOUS | Status: DC
  Administered 2022-09-04: 1000 mg via INTRAVENOUS
  Filled 2022-09-03: qty 200

## 2022-09-03 MED ORDER — ENOXAPARIN SODIUM 60 MG/0.6ML IJ SOSY
0.5000 mg/kg | PREFILLED_SYRINGE | INTRAMUSCULAR | Status: DC
  Administered 2022-09-03: 50 mg via SUBCUTANEOUS
  Filled 2022-09-03: qty 0.6

## 2022-09-03 MED ORDER — ACETAMINOPHEN 325 MG PO TABS
650.0000 mg | ORAL_TABLET | Freq: Four times a day (QID) | ORAL | Status: DC | PRN
  Administered 2022-09-05 (×2): 650 mg via ORAL
  Filled 2022-09-03 (×2): qty 2

## 2022-09-03 MED ORDER — METOPROLOL TARTRATE 25 MG PO TABS
25.0000 mg | ORAL_TABLET | Freq: Two times a day (BID) | ORAL | Status: DC
  Administered 2022-09-03 – 2022-09-07 (×8): 25 mg via ORAL
  Filled 2022-09-03 (×8): qty 1

## 2022-09-03 MED ORDER — DILTIAZEM HCL ER 240 MG PO TB24
240.0000 mg | ORAL_TABLET | ORAL | Status: DC
  Administered 2022-09-04 – 2022-09-07 (×4): 240 mg via ORAL
  Filled 2022-09-03: qty 1
  Filled 2022-09-03 (×3): qty 2
  Filled 2022-09-03 (×2): qty 1
  Filled 2022-09-03: qty 2
  Filled 2022-09-03: qty 1

## 2022-09-03 MED ORDER — WARFARIN - PHARMACIST DOSING INPATIENT
Freq: Every day | Status: DC
  Filled 2022-09-03: qty 1

## 2022-09-03 MED ORDER — TRAZODONE HCL 50 MG PO TABS: 25.0000 mg | ORAL_TABLET | Freq: Every evening | ORAL | Status: DC | PRN

## 2022-09-03 MED ORDER — OXYCODONE HCL 5 MG PO TABS: 5.0000 mg | ORAL_TABLET | ORAL | Status: DC | PRN

## 2022-09-03 MED ORDER — SODIUM CHLORIDE 0.9 % IV BOLUS
1000.0000 mL | Freq: Once | INTRAVENOUS | Status: AC
  Administered 2022-09-03: 1000 mL via INTRAVENOUS

## 2022-09-03 MED ORDER — ENOXAPARIN SODIUM 40 MG/0.4ML IJ SOSY: 40.0000 mg | PREFILLED_SYRINGE | INTRAMUSCULAR | Status: DC

## 2022-09-03 MED ORDER — MORPHINE SULFATE (PF) 2 MG/ML IV SOLN: 2.0000 mg | INTRAVENOUS | Status: DC | PRN

## 2022-09-03 MED ORDER — WARFARIN SODIUM 4 MG PO TABS: 4.0000 mg | ORAL_TABLET | Freq: Every day | ORAL | Status: DC

## 2022-09-03 MED ORDER — ACETAMINOPHEN 650 MG RE SUPP: 650.0000 mg | Freq: Four times a day (QID) | RECTAL | Status: DC | PRN

## 2022-09-03 MED ORDER — ATORVASTATIN CALCIUM 10 MG PO TABS
10.0000 mg | ORAL_TABLET | Freq: Every day | ORAL | Status: DC
  Administered 2022-09-04 – 2022-09-07 (×4): 10 mg via ORAL
  Filled 2022-09-03 (×4): qty 1

## 2022-09-03 NOTE — ED Provider Notes (Signed)
[facility] Provider Note    Event Date/Time   First MD Initiated Contact with Patient 09/03/22 1504     (approximate)   History   Fever and Wound Infection   HPI  Brandi Poole is a 77 y.o. female  with h/o HTN, DM, AFib on anticoagulation here with leg ulcer. Pt has ah /o chronic venous insufficiency, h/o ulcers in past but has nto seen anyone regularly. This time, it became worse than usual and she has had drainage from the wound with warmth. She then began spiking a fever with vomiting. She states this is the worse ulcer she's had. No direct trauma. No numbness or weakness.       Physical Exam   Triage Vital Signs: ED Triage Vitals  Enc Vitals Group     BP 09/03/22 1221 (!) 141/75     Pulse Rate 09/03/22 1221 100     Resp 09/03/22 1221 20     Temp 09/03/22 1221 98.6 F (37 C)     Temp Source 09/03/22 1221 Oral     SpO2 09/03/22 1221 91 %     Weight 09/03/22 1222 218 lb (98.9 kg)     Height 09/03/22 1222 5\' 3"  (1.6 m)     Head Circumference --      Peak Flow --      Pain Score 09/03/22 1221 3     Pain Loc --      Pain Edu? --      Excl. in GC? --     Most recent vital signs: Vitals:   09/03/22 1221  BP: (!) 141/75  Pulse: 100  Resp: 20  Temp: 98.6 F (37 C)  SpO2: 91%     General: Awake, no distress.  CV:  Good peripheral perfusion.  Resp:  Normal work of breathing. RRR.  Abd:  No distention. No tenderness. Other:  Left leg with significant erythema and warmth throughout the entire extremity to the proximal thigh.  On the left anterolateral shin there is an approximately 3 x 2 cm superficial ulcer draining purulent drainage.  No appreciable fluctuance.  Marked surrounding induration and diffuse erythema as above.  Chronic venous stasis changes noted.   ED Results / Procedures / Treatments   Labs (all labs ordered are listed, but only abnormal results are displayed) Labs Reviewed  LACTIC ACID, PLASMA - Abnormal; Notable  for the following components:      Result Value   Lactic Acid, Venous 2.0 (*)    All other components within normal limits  COMPREHENSIVE METABOLIC PANEL - Abnormal; Notable for the following components:   CO2 21 (*)    Glucose, Bld 147 (*)    Calcium 8.8 (*)    Total Bilirubin 2.0 (*)    All other components within normal limits  CBC WITH DIFFERENTIAL/PLATELET - Abnormal; Notable for the following components:   WBC 12.6 (*)    Neutro Abs 11.4 (*)    All other components within normal limits  CULTURE, BLOOD (ROUTINE X 2)  CULTURE, BLOOD (ROUTINE X 2)  LACTIC ACID, PLASMA  URINALYSIS, ROUTINE W REFLEX MICROSCOPIC  PROTIME-INR     EKG    RADIOLOGY CXR: Clear, no focal opacity   I also independently reviewed and agree with radiologist interpretations.   PROCEDURES:  Critical Care performed: Yes, see critical care procedure note(s)  .Critical Care  Performed by: Shaune Pollack, MD Authorized by: Shaune Pollack, MD   Critical care provider statement:  Critical care time (minutes):  30   Critical care time was exclusive of:  Separately billable procedures and treating other patients   Critical care was necessary to treat or prevent imminent or life-threatening deterioration of the following conditions:  Circulatory failure, cardiac failure, sepsis and respiratory failure   Critical care was time spent personally by me on the following activities:  Development of treatment plan with patient or surrogate, discussions with consultants, evaluation of patient's response to treatment, examination of patient, ordering and review of laboratory studies, ordering and review of radiographic studies, ordering and performing treatments and interventions, pulse oximetry, re-evaluation of patient's condition and review of old charts     MEDICATIONS ORDERED IN ED: Medications  cefTRIAXone (ROCEPHIN) 2 g in sodium chloride 0.9 % 100 mL IVPB (has no administration in time range)   sodium chloride 0.9 % bolus 1,000 mL (has no administration in time range)  sodium chloride 0.9 % bolus 1,000 mL (has no administration in time range)  vancomycin (VANCOREADY) IVPB 2000 mg/400 mL (has no administration in time range)     IMPRESSION / MDM / ASSESSMENT AND PLAN / ED COURSE  I reviewed the triage vital signs and the nursing notes.                              Differential diagnosis includes, but is not limited to, cellulitis with sepsis, venous stasis, lymphedema, lymphangitis, abscess, necrotizing infection, DVT  Patient's presentation is most consistent with acute presentation with potential threat to life or bodily function.  The patient is on the cardiac monitor to evaluate for evidence of arrhythmia and/or significant heart rate changes   77 year old female with history of hypertension, diabetes, A-fib on anticoagulation, here with left leg pain and swelling.  Exam is concerning for significant cellulitis extending to the proximal thigh.  She is tachycardic, febrile at home, with white count 12.6 meeting sepsis criteria.  Lactic acid is 2.0.  Blood pressures have been normal.  Patient given IV fluids, broad-spectrum antibiotics including Vanco and Rocephin, as well as symptomatic control.  Will admit to medicine.  Given normal INR's and exam highly consistent with infection, low concern for DVT at this time.  She is already anticoagulated.   FINAL CLINICAL IMPRESSION(S) / ED DIAGNOSES   Final diagnoses:  Sepsis due to cellulitis (HCC)  Wound of left lower extremity, initial encounter     Rx / DC Orders   ED Discharge Orders     None        Note:  This document was prepared using Dragon voice recognition software and may include unintentional dictation errors.   Shaune Pollack, MD 09/03/22 [id number]

## 2022-09-03 NOTE — Progress Notes (Signed)
PHARMACIST - PHYSICIAN COMMUNICATION  CONCERNING:  Enoxaparin (Lovenox) for DVT Prophylaxis    RECOMMENDATION: Patient was prescribed enoxaprin 40mg  q24 hours for VTE prophylaxis.   Filed Weights   09/03/22 1222  Weight: 98.9 kg (218 lb)    Body mass index is 38.62 kg/m.  Estimated Creatinine Clearance: 68.1 mL/min (by C-G formula based on SCr of 0.8 mg/dL).   Based on [facility] policy patient is candidate for enoxaparin 0.5mg /kg TBW SQ every 24 hours based on BMI being >30.  DESCRIPTION: Pharmacy has adjusted enoxaparin dose per [facility] policy.  Patient is now receiving enoxaparin 50 mg every 24 hours    Foye Deer, PharmD Clinical Pharmacist  09/03/2022 4:36 PM

## 2022-09-03 NOTE — Consult Note (Signed)
ANTICOAGULATION CONSULT NOTE  Pharmacy Consult for Warfarin dosing Indication: atrial fibrillation  No Known Allergies  Patient Measurements: Height: 5\' 3"  (160 cm) Weight: 98.9 kg (218 lb) IBW/kg (Calculated) : 52.4   Vital Signs: Temp: 98.6 F (37 C) (05/06 1221) Temp Source: Oral (05/06 1221) BP: 141/75 (05/06 1221) Pulse Rate: 100 (05/06 1221)  Labs: Recent Labs    09/03/22 1229 09/03/22 1550  HGB 14.1  --   HCT 41.8  --   PLT 208  --   LABPROT  --  22.2*  INR  --  2.0*  CREATININE 0.80  --     Estimated Creatinine Clearance: 68.1 mL/min (by C-G formula based on SCr of 0.8 mg/dL).   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  Reported last dose was 09/02/22 @ 1600.  Assessment: 77 yo female presented to the ED with fever, left leg pain and drainage from lower extremity ulcer.  Admitted for treatment of cellulitis with IV antibiotics. Patient has history of Afib currently on Warfarin.  Pharmacy has been consulted to manage warfarin dosing.  Interacting medication: Amiodarone  Goal of Therapy:  INR 2-3 Monitor platelets by anticoagulation protocol: Yes   Date INR Warfarin Dose  05/06 2.0 4 mg         Plan:  INR is therapeutic at 2.0 Will give 4 mg po x 1 tonight, continuing with reported home dose. Daily INR while inpatient CBC at least every 72 hours.  Barrie Folk, PharmD 09/03/2022,5:07 PM

## 2022-09-03 NOTE — H&P (Addendum)
History and Physical    Brandi Poole QMV:[id number] DOB: 1945-06-30 DOA: 09/03/2022  PCP: Marguarite Arbour, MD  Patient coming from: Home  I have personally briefly reviewed patient's old medical records in [facility]  Chief Complaint: Left leg pain, fever, draining ulcer  HPI: Brandi Poole is a 77 y.o. female with medical history significant of hypertension, diabetes, A-fib on anticoagulation with warfarin presents with leg ulcer.  Patient has a history of chronic venous insufficiency.  Patient has a history of ulcers in the past.  Does not see anyone regularly for this.  Patient endorses worsening leg ulcer over the past 2 weeks which progressed to drainage from the wound associated with warmth and pain.  No evidence of trauma.  Good pedal pulses on affected extremity.  On my evaluation patient is resting comfortably in bed.  She is in no visible distress.  She answers all questions appropriately.  She does have a exam significant for erythema of left shin associated with a draining ulcer on the anterior aspect.  All vital signs stable.  ED Course: Laboratory vesication revealed mild leukocytosis, elevated lactic acid to 2.0.  Patient was given a dose of vancomycin and Rocephin after blood cultures were drawn.  Hospitalist contacted for admission.  Review of Systems: As per HPI otherwise 14 point review of systems negative.    Past Medical History:  Diagnosis Date   Anxiety    Atrial fibrillation (HCC)    Cancer (HCC)    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   Depression    Diabetes mellitus without complication (HCC)    NO MEDS-DIET CONTROLLED   Fibrocystic breast disease    GERD (gastroesophageal reflux disease)    HLD (hyperlipidemia)    Hypertension    Obesity     Past Surgical History:  Procedure Laterality Date   BREAST CYST ASPIRATION     CARPAL TUNNEL RELEASE     COLONOSCOPY WITH PROPOFOL N/A 03/14/2021   Procedure: COLONOSCOPY WITH PROPOFOL;  Surgeon: Regis Bill, MD;  Location: [facility] ENDOSCOPY;  Service: Endoscopy;  Laterality: N/A;   DILATATION & CURETTAGE/HYSTEROSCOPY WITH MYOSURE N/A 02/08/2017   Procedure: DILATATION & CURETTAGE/HYSTEROSCOPY WITH MYOSURE;  Surgeon: Ward, Elenora Fender, MD;  Location: [facility] ORS;  Service: Gynecology;  Laterality: N/A;   FRACTURE SURGERY     BROKEN WRIST RIGHT AND BROKEN KNEE LEFT   KNEE SURGERY     removal on basal cell cancer       reports that she has never smoked. She has never used smokeless tobacco. She reports that she does not drink alcohol and does not use drugs.  No Known Allergies  Family History  Problem Relation Age of Onset   Breast cancer Neg Hx    No family history of chronic venous insufficiency or leg ulcers  Prior to Admission medications   Medication Sig Start Date End Date Taking? Authorizing Provider  atorvastatin (LIPITOR) 10 MG tablet Take 10 mg by mouth every morning.  01/29/17 01/29/18  [provider]  atorvastatin (LIPITOR) 10 MG tablet Take 1 tablet by mouth daily. 10/23/20   [provider]  diltiazem (DILACOR XR) 240 MG 24 hr capsule Take 240 mg by mouth every morning.  01/24/16   [provider]  famotidine (PEPCID) 20 MG tablet Take 20 mg by mouth every morning.  01/29/17 01/29/18  [provider]  furosemide (LASIX) 20 MG tablet Take 1 tablet (20 mg total) by mouth daily. 04/28/21 04/28/22  Donna Bernard  K, MD  hydrocortisone cream 0.5 % Apply 1 application topically 2 (two) times daily. To bilateral lower extremities 04/28/21   Merwyn Katos, MD  metoprolol tartrate (LOPRESSOR) 25 MG tablet Take 1 tablet by mouth 2 (two) times daily. 10/23/20   [provider]  metoprolol tartrate (LOPRESSOR) 50 MG tablet Take 2 tablets (100 mg total) by mouth 2 (two) times daily. 01/17/17 01/17/18  Rockne Menghini, MD  warfarin (COUMADIN) 2.5 MG tablet Take 2.5 mg by mouth daily. 01/23/17 01/23/18  [provider]  warfarin (COUMADIN) 4  MG tablet Take 1 tablet by mouth daily. 10/23/20   [provider]    Physical Exam: Vitals:   09/03/22 1221 09/03/22 1222  BP: (!) 141/75   Pulse: 100   Resp: 20   Temp: 98.6 F (37 C)   TempSrc: Oral   SpO2: 91%   Weight:  98.9 kg  Height:  5\' 3"  (1.6 m)    Vitals:   09/03/22 1221 09/03/22 1222  BP: (!) 141/75   Pulse: 100   Resp: 20   Temp: 98.6 F (37 C)   TempSrc: Oral   SpO2: 91%   Weight:  98.9 kg  Height:  5\' 3"  (1.6 m)   General: No apparent distress, patient appears well HEENT: Normocephalic, atraumatic Neck, supple, trachea midline, no tenderness Heart: Regular rate and rhythm, S1/S2 normal, no murmurs Lungs: Clear to auscultation bilaterally, no adventitious sounds, normal work of breathing Abdomen: Obese, soft, NT/ND, normal bowel sounds Extremities: Normal, atraumatic, no clubbing or cyanosis, normal muscle tone Skin: Left shin erythematous on anterior aspect with 4 cm draining ulcer Neurologic: Cranial nerves grossly intact, sensation intact, alert and oriented x3 Psychiatric: Normal affect   Labs on Admission: I have personally reviewed following labs and imaging studies  CBC: Recent Labs  Lab 09/03/22 1229  WBC 12.6*  NEUTROABS 11.4*  HGB 14.1  HCT 41.8  MCV 88.9  PLT 208   Basic Metabolic Panel: Recent Labs  Lab 09/03/22 1229  NA 137  K 3.8  CL 104  CO2 21*  GLUCOSE 147*  BUN 15  CREATININE 0.80  CALCIUM 8.8*   GFR: Estimated Creatinine Clearance: 68.1 mL/min (by C-G formula based on SCr of 0.8 mg/dL). Liver Function Tests: Recent Labs  Lab 09/03/22 1229  AST 37  ALT 19  ALKPHOS 73  BILITOT 2.0*  PROT 7.0  ALBUMIN 3.6   No results for input(s): "LIPASE", "AMYLASE" in the last 168 hours. No results for input(s): "AMMONIA" in the last 168 hours. Coagulation Profile: No results for input(s): "INR", "PROTIME" in the last 168 hours. Cardiac Enzymes: No results for input(s): "CKTOTAL", "CKMB", "CKMBINDEX",  "TROPONINI" in the last 168 hours. BNP (last 3 results) No results for input(s): "PROBNP" in the last 8760 hours. HbA1C: No results for input(s): "HGBA1C" in the last 72 hours. CBG: No results for input(s): "GLUCAP" in the last 168 hours. Lipid Profile: No results for input(s): "CHOL", "HDL", "LDLCALC", "TRIG", "CHOLHDL", "LDLDIRECT" in the last 72 hours. Thyroid Function Tests: No results for input(s): "TSH", "T4TOTAL", "FREET4", "T3FREE", "THYROIDAB" in the last 72 hours. Anemia Panel: No results for input(s): "VITAMINB12", "FOLATE", "FERRITIN", "TIBC", "IRON", "RETICCTPCT" in the last 72 hours. Urine analysis:    Component Value Date/Time   COLORURINE AMBER (A) 09/03/2022 1229   APPEARANCEUR CLOUDY (A) 09/03/2022 1229   LABSPEC 1.029 09/03/2022 1229   PHURINE 5.0 09/03/2022 1229   GLUCOSEU NEGATIVE 09/03/2022 1229   HGBUR NEGATIVE 09/03/2022 1229  BILIRUBINUR NEGATIVE 09/03/2022 1229   KETONESUR 5 (A) 09/03/2022 1229   PROTEINUR 30 (A) 09/03/2022 1229   NITRITE NEGATIVE 09/03/2022 1229   LEUKOCYTESUR TRACE (A) 09/03/2022 1229    Radiological Exams on Admission: DG Chest 2 View  Result Date: 09/03/2022 CLINICAL DATA:  History of leg cellulitis with fevers and chills EXAM: CHEST - 2 VIEW COMPARISON:  X-ray 01/17/2017 FINDINGS: Calcified aorta. Normal cardiopericardial silhouette. No pneumothorax, effusion or consolidation. Interstitial changes noted bilaterally which are increased from the previous examination. Acute versus chronic. Recommend short follow-up. Degenerative changes of the spine IMPRESSION: New diffuse scattered interstitial prominence. Acute versus chronic. Please correlate with symptoms and short follow-up is recommended. No effusion or consolidation Electronically Signed   By: Karen Kays M.D.   On: 09/03/2022 13:15      Assessment/Plan Principal Problem:   Cellulitis of lower extremity Active Problems:   Essential hypertension   Atrial fibrillation (HCC)    Hyperlipidemia   Venous ulcer of ankle, left (HCC)   Chronic venous insufficiency  Purulent cellulitis of left lower extremity Severe sepsis secondary to above Sepsis criteria met with tachycardia pulse rate 100 and leukocytosis 12.6.  Severe sepsis criteria met with elevated lactic acid to 2.0. Plan: Place in observation Continue empiric vancomycin and Rocephin for today Can likely de-escalate in a.m. IV fluids Follow cultures Monitor vitals and fever curve If physical exam does not improve or patient has signs of clinical worsening will pursue cross-sectional imaging of affected extremity  Paroxysmal atrial fibrillation Chronic anticoagulation Patient is reasonably well rate controlled Plan: Continue home diltiazem and metoprolol Continue home warfarin, will consult pharmacy for dosing assistance  Essential hypertension Cardizem and metoprolol as above  Hyperlipidemia PTA Lipitor 10 mg daily  Obesity Criteria met with BMI 38 with 1 or more chronic comorbidities.  This complicates overall care and prognosis.   DVT prophylaxis: SQ Lovenox Code Status: Full Family Communication: None today  Disposition Plan: Anticipate return to previous home environment Consults called: None Admission status: Observation, MedSurg   Tresa Moore MD [facility]   If 7PM-7AM, please contact night-coverage   09/03/2022, 4:44 PM

## 2022-09-03 NOTE — ED Notes (Signed)
Md Paduchowski informed of lactic result of 2.0

## 2022-09-03 NOTE — Consult Note (Signed)
Pharmacy Antibiotic Note  Brandi Poole is a 77 y.o. female admitted on 09/03/2022 with cellulitis.  Pharmacy has been consulted for vancomycin dosing.  Plan: Vancomycin 2000 mg IV x 1, followed by 1000 mg IV every 24 hours AUC goal: 400-550 Estimated AUC: 438.5, Cmin 10.5 [place] 0.8, wt 98.9 kg, Vd coefficient 0.5 Vancomycin levels at steady state or as clinically indicated Ceftriaxone 2 grams IV every 24 hours ordered by provider Follow renal function and cultures for adjustments  Height: 5\' 3"  (160 cm) Weight: 98.9 kg (218 lb) IBW/kg (Calculated) : 52.4  Temp (24hrs), Avg:98.6 F (37 C), Min:98.6 F (37 C), Max:98.6 F (37 C)  Recent Labs  Lab 09/03/22 1229 09/03/22 1600  WBC 12.6*  --   CREATININE 0.80  --   LATICACIDVEN 2.0* 2.3*    Estimated Creatinine Clearance: 68.1 mL/min (by C-G formula based on SCr of 0.8 mg/dL).    No Known Allergies  Antimicrobials this admission: Ceftriaxone 5/6 >>  vancomycin 6/6 >>   Dose adjustments this admission: N/A  Microbiology results: 5/6 BCx: pending  Thank you for allowing pharmacy to be a part of this patient's care.  Barrie Folk, PharmD 09/03/2022 4:43 PM

## 2022-09-03 NOTE — ED Triage Notes (Signed)
Patient presents with left shin wound that is weeping yellow drainage. History cellulitis. C/o intermittent fever and chills

## 2022-09-03 NOTE — Consult Note (Signed)
PHARMACY -  BRIEF ANTIBIOTIC NOTE   Pharmacy has received consult(s) for vancomycin dosing from an ED provider.  The patient's profile has been reviewed for ht/wt/allergies/indication/available labs.    One time order(s) placed for vancomycin 2000 mg IV x 1  Further antibiotics/pharmacy consults should be ordered by admitting physician if indicated.                       Thank you, Barrie Folk, PharmD 09/03/2022  3:45 PM

## 2022-09-04 ENCOUNTER — Encounter: Payer: Self-pay | Admitting: Internal Medicine

## 2022-09-04 ENCOUNTER — Inpatient Hospital Stay: Payer: 59

## 2022-09-04 DIAGNOSIS — B9689 Other specified bacterial agents as the cause of diseases classified elsewhere: Secondary | ICD-10-CM | POA: Diagnosis present

## 2022-09-04 DIAGNOSIS — R7881 Bacteremia: Secondary | ICD-10-CM | POA: Diagnosis not present

## 2022-09-04 DIAGNOSIS — Z79899 Other long term (current) drug therapy: Secondary | ICD-10-CM | POA: Diagnosis not present

## 2022-09-04 DIAGNOSIS — A419 Sepsis, unspecified organism: Secondary | ICD-10-CM | POA: Diagnosis present

## 2022-09-04 DIAGNOSIS — A280 Pasteurellosis: Secondary | ICD-10-CM

## 2022-09-04 DIAGNOSIS — R600 Localized edema: Secondary | ICD-10-CM

## 2022-09-04 DIAGNOSIS — F32A Depression, unspecified: Secondary | ICD-10-CM | POA: Diagnosis present

## 2022-09-04 DIAGNOSIS — R6520 Severe sepsis without septic shock: Secondary | ICD-10-CM | POA: Diagnosis present

## 2022-09-04 DIAGNOSIS — I872 Venous insufficiency (chronic) (peripheral): Secondary | ICD-10-CM | POA: Diagnosis present

## 2022-09-04 DIAGNOSIS — K219 Gastro-esophageal reflux disease without esophagitis: Secondary | ICD-10-CM | POA: Diagnosis present

## 2022-09-04 DIAGNOSIS — E872 Acidosis, unspecified: Secondary | ICD-10-CM | POA: Diagnosis present

## 2022-09-04 DIAGNOSIS — I482 Chronic atrial fibrillation, unspecified: Secondary | ICD-10-CM | POA: Diagnosis present

## 2022-09-04 DIAGNOSIS — Z6838 Body mass index (BMI) 38.0-38.9, adult: Secondary | ICD-10-CM | POA: Diagnosis not present

## 2022-09-04 DIAGNOSIS — I4811 Longstanding persistent atrial fibrillation: Secondary | ICD-10-CM | POA: Diagnosis not present

## 2022-09-04 DIAGNOSIS — L97329 Non-pressure chronic ulcer of left ankle with unspecified severity: Secondary | ICD-10-CM | POA: Diagnosis present

## 2022-09-04 DIAGNOSIS — L97529 Non-pressure chronic ulcer of other part of left foot with unspecified severity: Secondary | ICD-10-CM

## 2022-09-04 DIAGNOSIS — Z7901 Long term (current) use of anticoagulants: Secondary | ICD-10-CM | POA: Diagnosis not present

## 2022-09-04 DIAGNOSIS — I10 Essential (primary) hypertension: Secondary | ICD-10-CM | POA: Diagnosis present

## 2022-09-04 DIAGNOSIS — E669 Obesity, unspecified: Secondary | ICD-10-CM | POA: Diagnosis present

## 2022-09-04 DIAGNOSIS — I480 Paroxysmal atrial fibrillation: Secondary | ICD-10-CM | POA: Diagnosis present

## 2022-09-04 DIAGNOSIS — L03116 Cellulitis of left lower limb: Secondary | ICD-10-CM | POA: Diagnosis present

## 2022-09-04 DIAGNOSIS — Z85828 Personal history of other malignant neoplasm of skin: Secondary | ICD-10-CM | POA: Diagnosis not present

## 2022-09-04 DIAGNOSIS — E785 Hyperlipidemia, unspecified: Secondary | ICD-10-CM | POA: Diagnosis present

## 2022-09-04 DIAGNOSIS — E119 Type 2 diabetes mellitus without complications: Secondary | ICD-10-CM | POA: Diagnosis present

## 2022-09-04 LAB — BLOOD CULTURE ID PANEL (REFLEXED) - BCID2

## 2022-09-04 LAB — CULTURE, BLOOD (ROUTINE X 2): Special Requests: ADEQUATE

## 2022-09-04 LAB — BASIC METABOLIC PANEL
Anion gap: 8 (ref 5–15)
BUN: 14 mg/dL (ref 8–23)
CO2: 21 mmol/L — ABNORMAL LOW (ref 22–32)
Calcium: 7.8 mg/dL — ABNORMAL LOW (ref 8.9–10.3)
Chloride: 107 mmol/L (ref 98–111)
Creatinine, Ser: 0.71 mg/dL (ref 0.44–1.00)
GFR, Estimated: >60 mL/min (ref >60)
Glucose, Bld: 113 mg/dL — ABNORMAL HIGH (ref 70–99)
Potassium: 3.5 mmol/L (ref 3.5–5.1)
Sodium: 136 mmol/L (ref 135–145)

## 2022-09-04 LAB — CBC
HCT: 36.6 % (ref 36.0–46.0)
Hemoglobin: 12.1 g/dL (ref 12.0–15.0)
MCH: 29.9 pg (ref 26.0–34.0)
MCHC: 33.1 g/dL (ref 30.0–36.0)
MCV: 90.4 fL (ref 80.0–100.0)
Platelets: 162 10*3/uL (ref 150–400)
RBC: 4.05 MIL/uL (ref 3.87–5.11)
RDW: 14.9 % (ref 11.5–15.5)
WBC: 14 10*3/uL — ABNORMAL HIGH (ref 4.0–10.5)
nRBC: 0 % (ref 0.0–0.2)

## 2022-09-04 LAB — PROTIME-INR
INR: 2.3 — ABNORMAL HIGH (ref 0.8–1.2)
Prothrombin Time: 24.8 seconds — ABNORMAL HIGH (ref 11.4–15.2)

## 2022-09-04 LAB — LACTIC ACID, PLASMA: Lactic Acid, Venous: 1.3 mmol/L (ref 0.5–1.9)

## 2022-09-04 MED ORDER — QUETIAPINE FUMARATE 25 MG PO TABS
25.0000 mg | ORAL_TABLET | Freq: Every day | ORAL | Status: DC
  Administered 2022-09-04 – 2022-09-06 (×3): 25 mg via ORAL
  Filled 2022-09-04 (×3): qty 1

## 2022-09-04 MED ORDER — PIPERACILLIN-TAZOBACTAM 3.375 G IVPB
3.3750 g | Freq: Three times a day (TID) | INTRAVENOUS | Status: DC
  Administered 2022-09-04 – 2022-09-05 (×2): 3.375 g via INTRAVENOUS
  Filled 2022-09-04 (×2): qty 50

## 2022-09-04 MED ORDER — WARFARIN SODIUM 2 MG PO TABS
2.0000 mg | ORAL_TABLET | Freq: Once | ORAL | Status: AC
  Administered 2022-09-04: 2 mg via ORAL
  Filled 2022-09-04: qty 1

## 2022-09-04 MED ORDER — ALPRAZOLAM 0.25 MG PO TABS
0.2500 mg | ORAL_TABLET | Freq: Three times a day (TID) | ORAL | Status: DC | PRN
  Administered 2022-09-04 – 2022-09-06 (×4): 0.25 mg via ORAL
  Filled 2022-09-04 (×4): qty 1

## 2022-09-04 NOTE — Consult Note (Signed)
NAME: Brandi Poole  DOB: 01-09-1946  MRN: [id number]  Date/Time: 09/04/2022 2:31 PM  REQUESTING PROVIDER: Dr.Sreenath Subjective:  REASON FOR CONSULT: gram neg bacteremia ? Brandi Poole is a 77 y.o. with a history of Afib, chronic venous edema, leg ulcer presents from home with worsening edema and oozing from  left shin wound She denied any fever In the ED Vitals were   09/03/22 12:21  BP 141/75 !  Temp 98.6 F (37 C)  Pulse Rate 100  Resp 20  SpO2 91 %    Latest Reference Range & Units 09/03/22  WBC 4.0 - 10.5 K/uL 12.6 (H)  Hemoglobin 12.0 - 15.0 g/dL 40.9  HCT 81.1 - 91.4 % 41.8  Platelets 150 - 400 K/uL 208  Creatinine 0.44 - 1.00 mg/dL 7.82  Blood culture sent and she was started on vanco/ceftriaxone I am seeing patient as she has 4/4 gram neg bacteremia Pt has cats but denies any scratches or bites She denies any injury No fishing No steroids No travel No swimming Lives on her own Does not drive  Past Medical History:  Diagnosis Date   Anxiety    Atrial fibrillation (HCC)    Cancer (HCC)    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   Depression    Diabetes mellitus without complication (HCC)    NO MEDS-DIET CONTROLLED   Fibrocystic breast disease    GERD (gastroesophageal reflux disease)    HLD (hyperlipidemia)    Hypertension    Obesity     Past Surgical History:  Procedure Laterality Date   BREAST CYST ASPIRATION     CARPAL TUNNEL RELEASE     COLONOSCOPY WITH PROPOFOL N/A 03/14/2021   Procedure: COLONOSCOPY WITH PROPOFOL;  Surgeon: Regis Bill, MD;  Location: [facility] ENDOSCOPY;  Service: Endoscopy;  Laterality: N/A;   DILATATION & CURETTAGE/HYSTEROSCOPY WITH MYOSURE N/A 02/08/2017   Procedure: DILATATION & CURETTAGE/HYSTEROSCOPY WITH MYOSURE;  Surgeon: Ward, Elenora Fender, MD;  Location: [facility] ORS;  Service: Gynecology;  Laterality: N/A;   FRACTURE SURGERY     BROKEN WRIST RIGHT AND BROKEN KNEE LEFT   KNEE SURGERY     removal on basal cell cancer      Social  History   Socioeconomic History   Marital status: Married    Spouse name: Not on file   Number of children: Not on file   Years of education: Not on file   Highest education level: Not on file  Occupational History   Not on file  Tobacco Use   Smoking status: Never   Smokeless tobacco: Never  Vaping Use   Vaping Use: Never used  Substance and Sexual Activity   Alcohol use: No   Drug use: No   Sexual activity: Not on file  Other Topics Concern   Not on file  Social History Narrative   Not on file   Social Determinants of Health   Financial Resource Strain: Not on file  Food Insecurity: No Food Insecurity (09/04/2022)   Hunger Vital Sign    Worried About Running Out of Food in the Last Year: Never true    Ran Out of Food in the Last Year: Never true  Transportation Needs: No Transportation Needs (09/04/2022)   PRAPARE - Administrator, Civil Service (Medical): No    Lack of Transportation (Non-Medical): No  Physical Activity: Not on file  Stress: Not on file  Social Connections: Not on file  Intimate Partner Violence: Not At Risk (09/04/2022)   Humiliation,  Afraid, Rape, and Kick questionnaire    Fear of Current or Ex-Partner: No    Emotionally Abused: No    Physically Abused: No    Sexually Abused: No    Family History  Problem Relation Age of Onset   Breast cancer Neg Hx    No Known Allergies I? Current Facility-Administered Medications  Medication Dose Route Frequency Provider Last Rate Last Admin   acetaminophen (TYLENOL) tablet 650 mg  650 mg Oral Q6H PRN Lolita Patella B, MD       Or   acetaminophen (TYLENOL) suppository 650 mg  650 mg Rectal Q6H PRN Sreenath, Sudheer B, MD       albuterol (PROVENTIL) (2.5 MG/3ML) 0.083% nebulizer solution 2.5 mg  2.5 mg Nebulization Q2H PRN Georgeann Oppenheim, Sudheer B, MD       atorvastatin (LIPITOR) tablet 10 mg  10 mg Oral Daily Georgeann Oppenheim, Sudheer B, MD   10 mg at 09/04/22 1108   cefTRIAXone (ROCEPHIN) 2 g in sodium  chloride 0.9 % 100 mL IVPB  2 g Intravenous Q24H Sreenath, Sudheer B, MD       diltiazem (CARDIZEM LA) 24 hr tablet 240 mg  240 mg Oral Maretta Bees, Sudheer B, MD   240 mg at 09/04/22 0820   famotidine (PEPCID) tablet 20 mg  20 mg Oral Maretta Bees, Sudheer B, MD   20 mg at 09/04/22 0820   hydrALAZINE (APRESOLINE) injection 10 mg  10 mg Intravenous Q4H PRN Lolita Patella B, MD       lactated ringers infusion   Intravenous Continuous Lolita Patella B, MD 75 mL/hr at 09/03/22 1900 New Bag at 09/03/22 1900   metoprolol tartrate (LOPRESSOR) tablet 25 mg  25 mg Oral BID Lolita Patella B, MD   25 mg at 09/04/22 1108   morphine (PF) 2 MG/ML injection 2 mg  2 mg Intravenous Q2H PRN Lolita Patella B, MD       ondansetron (ZOFRAN) tablet 4 mg  4 mg Oral Q6H PRN Georgeann Oppenheim, Sudheer B, MD       Or   ondansetron (ZOFRAN) injection 4 mg  4 mg Intravenous Q6H PRN Sreenath, Sudheer B, MD       oxyCODONE (Oxy IR/ROXICODONE) immediate release tablet 5 mg  5 mg Oral Q4H PRN Lolita Patella B, MD       traZODone (DESYREL) tablet 25 mg  25 mg Oral QHS PRN Georgeann Oppenheim, Sudheer B, MD       vancomycin (VANCOCIN) IVPB 1000 mg/200 mL premix  1,000 mg Intravenous Q24H Prince Solian F, RPH       warfarin (COUMADIN) tablet 2 mg  2 mg Oral ONCE-1600 Sreenath, Sudheer B, MD       Warfarin - Pharmacist Dosing Inpatient   Does not apply q1600 Barrie Folk, RPH         Abtx:  Anti-infectives (From admission, onward)    Start     Dose/Rate Route Frequency Ordered Stop   09/04/22 1630  vancomycin (VANCOCIN) IVPB 1000 mg/200 mL premix        1,000 mg 200 mL/hr over 60 Minutes Intravenous Every 24 hours 09/03/22 1646     09/04/22 1600  cefTRIAXone (ROCEPHIN) 2 g in sodium chloride 0.9 % 100 mL IVPB        2 g 200 mL/hr over 30 Minutes Intravenous Every 24 hours 09/03/22 1632     09/03/22 1600  vancomycin (VANCOREADY) IVPB 2000 mg/400 mL        2,000 mg 200  mL/hr over 120 Minutes Intravenous  Once  09/03/22 1545 09/03/22 1841   09/03/22 1545  cefTRIAXone (ROCEPHIN) 2 g in sodium chloride 0.9 % 100 mL IVPB        2 g 200 mL/hr over 30 Minutes Intravenous  Once 09/03/22 1537 09/03/22 1641       REVIEW OF SYSTEMS:  Const: negative fever, negative chills, negative weight loss Eyes: negative diplopia or visual changes, negative eye pain ENT: negative coryza, negative sore throat Resp: negative cough, hemoptysis, dyspnea Cards: negative for chest pain, palpitations, lower extremity edema GU: negative for frequency, dysuria and hematuria GI: Negative for abdominal pain, diarrhea, bleeding, constipation Skin: negative for rash and pruritus Heme: negative for easy bruising and gum/nose bleeding MS: negative for myalgias, arthralgias, back pain and muscle weakness Neurolo:negative for headaches, dizziness, vertigo, memory problems  Psych: negative for feelings of anxiety, depression  Endocrine: negative for thyroid, diabetes Allergy/Immunology- negative for any medication or food allergies ?  Objective:  VITALS:  BP (!) 164/87 (BP Location: Right Arm)   Pulse 94   Temp 98.5 F (36.9 C) (Oral)   Resp 16   Ht 5\' 3"  (1.6 m)   Wt 98.9 kg   SpO2 96%   BMI 38.62 kg/m   PHYSICAL EXAM: she is oriented in person, place, year Doe snot knw the month- says she does not care about what time or month it is She has been wandering on the floor and trying to get out Also washing the dishes in the sink Says she cannot sit still  Head: Normocephalic, without obvious abnormality, atraumatic. Eyes: Conjunctivae clear, anicteric sclerae. Pupils are equal ENT Nares normal. No drainage or sinus tenderness. Lips, mucosa, and tongue normal. No Thrush Neck: Supple, symmetrical, no adenopathy, thyroid: non tender no carotid bruit and no JVD. Back: No CVA tenderness. Lungs: Clear to auscultation bilaterally. No Wheezing or Rhonchi. No rales. Heart: irregular. Abdomen: Soft, non-tender,not  distended. Bowel sounds normal. No masses Extremities:  edema legs- left >> rt Deroofed blister Superficial wound  Skin: No rashes or lesions. Or bruising Lymph: Cervical, supraclavicular normal. Neurologic: Grossly non-focal Pertinent Labs Lab Results CBC    Component Value Date/Time   WBC 14.0 (H) 09/04/2022 0256   RBC 4.05 09/04/2022 0256   HGB 12.1 09/04/2022 0256   HCT 36.6 09/04/2022 0256   PLT 162 09/04/2022 0256   MCV 90.4 09/04/2022 0256   MCH 29.9 09/04/2022 0256   MCHC 33.1 09/04/2022 0256   RDW 14.9 09/04/2022 0256   LYMPHSABS 0.8 09/03/2022 1229   MONOABS 0.4 09/03/2022 1229   EOSABS 0.0 09/03/2022 1229   BASOSABS 0.1 09/03/2022 1229       Latest Ref Rng & Units 09/04/2022    2:56 AM 09/03/2022   12:29 PM 04/28/2021   12:59 PM  CMP  Glucose 70 - 99 mg/dL 161  096  045   BUN 8 - 23 mg/dL 14  15  10    Creatinine 0.44 - 1.00 mg/dL 4.09  8.11  9.14   Sodium 135 - 145 mmol/L 136  137  140   Potassium 3.5 - 5.1 mmol/L 3.5  3.8  3.9   Chloride 98 - 111 mmol/L 107  104  105   CO2 22 - 32 mmol/L 21  21  26    Calcium 8.9 - 10.3 mg/dL 7.8  8.8  9.5   Total Protein 6.5 - 8.1 g/dL  7.0  7.8   Total Bilirubin 0.3 - 1.2 mg/dL  2.0  1.8   Alkaline Phos 38 - 126 U/L  73  95   AST 15 - 41 U/L  37  27   ALT 0 - 44 U/L  19  17       Microbiology: Recent Results (from the past 240 hour(s))  Blood culture (routine x 2)     Status: None (Preliminary result)   Collection Time: 09/03/22  4:00 PM   Specimen: BLOOD  Result Value Ref Range Status   Specimen Description BLOOD BLOOD RIGHT FOREARM  Final   Special Requests   Final    BOTTLES DRAWN AEROBIC AND ANAEROBIC Blood Culture adequate volume   Culture  Setup Time   Final    GRAM NEGATIVE RODS IN BOTH AEROBIC AND ANAEROBIC BOTTLES CRITICAL VALUE NOTED.  VALUE IS CONSISTENT WITH PREVIOUSLY REPORTED AND CALLED VALUE. Performed at [facility], [address] [place], [place] [address]    Culture GRAM  NEGATIVE RODS  Final   Report Status PENDING  Incomplete  Blood culture (routine x 2)     Status: None (Preliminary result)   Collection Time: 09/03/22  4:00 PM   Specimen: BLOOD  Result Value Ref Range Status   Specimen Description BLOOD RIGHT ANTECUBITAL  Final   Special Requests   Final    BOTTLES DRAWN AEROBIC AND ANAEROBIC Blood Culture adequate volume   Culture  Setup Time   Final    GRAM NEGATIVE RODS IN BOTH AEROBIC AND ANAEROBIC BOTTLES Organism ID to follow CRITICAL RESULT CALLED TO, READ BACK BY AND VERIFIED WITH: JASON [email or web address]  09/04/22 RH Performed at [facility], [address] [place], [place] [address]    Culture GRAM NEGATIVE RODS  Final   Report Status PENDING  Incomplete  Blood Culture ID Panel (Reflexed)     Status: None   Collection Time: 09/03/22  4:00 PM  Result Value Ref Range Status   Enterococcus faecalis NOT DETECTED NOT DETECTED Final   Enterococcus Faecium NOT DETECTED NOT DETECTED Final   Listeria monocytogenes NOT DETECTED NOT DETECTED Final   Staphylococcus species NOT DETECTED NOT DETECTED Final   Staphylococcus aureus (BCID) NOT DETECTED NOT DETECTED Final   Staphylococcus epidermidis NOT DETECTED NOT DETECTED Final   Staphylococcus lugdunensis NOT DETECTED NOT DETECTED Final   Streptococcus species NOT DETECTED NOT DETECTED Final   Streptococcus agalactiae NOT DETECTED NOT DETECTED Final   Streptococcus pneumoniae NOT DETECTED NOT DETECTED Final   Streptococcus pyogenes NOT DETECTED NOT DETECTED Final   A.calcoaceticus-baumannii NOT DETECTED NOT DETECTED Final   Bacteroides fragilis NOT DETECTED NOT DETECTED Final   Enterobacterales NOT DETECTED NOT DETECTED Final   Enterobacter cloacae complex NOT DETECTED NOT DETECTED Final   Escherichia coli NOT DETECTED NOT DETECTED Final   Klebsiella aerogenes NOT DETECTED NOT DETECTED Final   Klebsiella oxytoca NOT DETECTED NOT DETECTED Final   Klebsiella pneumoniae NOT DETECTED NOT  DETECTED Final   Proteus species NOT DETECTED NOT DETECTED Final   Salmonella species NOT DETECTED NOT DETECTED Final   Serratia marcescens NOT DETECTED NOT DETECTED Final   Haemophilus influenzae NOT DETECTED NOT DETECTED Final   Neisseria meningitidis NOT DETECTED NOT DETECTED Final   Pseudomonas aeruginosa NOT DETECTED NOT DETECTED Final   Stenotrophomonas maltophilia NOT DETECTED NOT DETECTED Final   Candida albicans NOT DETECTED NOT DETECTED Final   Candida auris NOT DETECTED NOT DETECTED Final   Candida glabrata NOT DETECTED NOT DETECTED Final   Candida krusei NOT DETECTED NOT DETECTED Final   Candida  parapsilosis NOT DETECTED NOT DETECTED Final   Candida tropicalis NOT DETECTED NOT DETECTED Final   Cryptococcus neoformans/gattii NOT DETECTED NOT DETECTED Final    Comment: Performed at [facility], [address] [place], [place] [address]    IMAGING RESULTS: I have personally reviewed the films ? Impression/Recommendation ? Gram negative bacteremia- not ID by BCID Because of her having cats it could be pasteurella No risk for vibrio, shigella Will dc vanco Change ceftriaxone to zosyn to broaden gram neg coverage  Venous edema legs  Chronic Afib  Possible early neurocog impairment Vs ? Encephalopathy Ct no acute findings   ? ___________________________________________________ Discussed with patient, her nurse and requesting provider Note:  This document was prepared using Dragon voice recognition software and may include unintentional dictation errors.

## 2022-09-04 NOTE — Consult Note (Signed)
Pharmacy Antibiotic Note  Brandi Poole is a 77 y.o. female admitted on 09/03/2022 with  wound infection .  Pharmacy has been consulted for pip/tazo dosing. Afeb, WBC trending up (12 > 14). Bcx 4 out of 4 growing GNR. BCID did not detect anything.   Plan: Zosyn 3.375g IV q8h (4 hour infusion).  Height: 5\' 3"  (160 cm) Weight: 98.9 kg (218 lb) IBW/kg (Calculated) : 52.4  Temp (24hrs), Avg:98.7 F (37.1 C), Min:97.9 F (36.6 C), Max:99.5 F (37.5 C)  Recent Labs  Lab 09/03/22 1229 09/03/22 1600 09/04/22 0256 09/04/22 1029  WBC 12.6*  --  14.0*  --   CREATININE 0.80  --  0.71  --   LATICACIDVEN 2.0* 2.3*  --  1.3    Estimated Creatinine Clearance: 68.1 mL/min (by C-G formula based on SCr of 0.71 mg/dL).    No Known Allergies  Antimicrobials this admission: 5/6 ceftriaxone >> 5/7 5/6 vancomycin >> 5/7 5/7 pip/tazo >>   Dose adjustments this admission: None  Microbiology results: 5/6 BCx: 4 out of 4 bottles growing GNR. BCID negative.  5/7 left leg wound cx x 2: pending.   Thank you for allowing pharmacy to be a part of this patient's care.  Ronnald Ramp, PharmD, BCPS 09/04/2022 7:34 PM

## 2022-09-04 NOTE — Consult Note (Signed)
ANTICOAGULATION CONSULT NOTE  Pharmacy Consult for Warfarin dosing Indication: atrial fibrillation  No Known Allergies  Patient Measurements: Height: 5\' 3"  (160 cm) Weight: 98.9 kg (218 lb) IBW/kg (Calculated) : 52.4   Vital Signs: Temp: 98.5 F (36.9 C) (05/07 0810) Temp Source: Oral (05/07 0810) BP: 164/87 (05/07 0810) Pulse Rate: 94 (05/07 0810)  Labs: Recent Labs    09/03/22 1229 09/03/22 1550 09/04/22 0256  HGB 14.1  --  12.1  HCT 41.8  --  36.6  PLT 208  --  162  LABPROT  --  22.2* 24.8*  INR  --  2.0* 2.3*  CREATININE 0.80  --  0.71     Estimated Creatinine Clearance: 68.1 mL/min (by C-G formula based on SCr of 0.71 mg/dL).   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  Reported last dose was 09/02/22 @ 1600.  Assessment: 77 yo female presented to the ED with fever, left leg pain and drainage from lower extremity ulcer.  Admitted for treatment of cellulitis with IV antibiotics. Patient has history of Afib currently on Warfarin.  Pharmacy has been consulted to manage warfarin dosing.  Interacting medication: Amiodarone  Goal of Therapy:  INR 2-3 Monitor platelets by anticoagulation protocol: Yes   Date INR Warfarin Dose  05/06 2.0 4 mg  05/07 2.3 2mg              Plan:  INR remains therapeutic at 2.3 Will give 2 mg po x 1 tonight Daily INR while inpatient CBC at least every 72 hours.  Shereen Marton Rodriguez-Guzman PharmD, BCPS 09/04/2022 9:05 AM

## 2022-09-04 NOTE — Progress Notes (Addendum)
PROGRESS NOTE    Brandi Poole  ZOX:[id number] DOB: 05-Jun-1945 DOA: 09/03/2022 PCP: Marguarite Arbour, MD    Brief Narrative:  77 y.o. female with medical history significant of hypertension, diabetes, A-fib on anticoagulation with warfarin presents with leg ulcer.  Patient has a history of chronic venous insufficiency.  Patient has a history of ulcers in the past.  Does not see anyone regularly for this.  Patient endorses worsening leg ulcer over the past 2 weeks which progressed to drainage from the wound associated with warmth and pain.  No evidence of trauma.  Good pedal pulses on affected extremity.   On my evaluation patient is resting comfortably in bed.  She is in no visible distress.  She answers all questions appropriately.  She does have a exam significant for erythema of left shin associated with a draining ulcer on the anterior aspect.  All vital signs stable.  5/7: Patient remained stable overnight.  Surprisingly blood cultures positive for gram-negative rods both aerobic and anaerobic bottles.  Source unclear.  Suspect urine.   Assessment & Plan:   Principal Problem:   Cellulitis of lower extremity Active Problems:   Essential hypertension   Atrial fibrillation (HCC)   Hyperlipidemia   Venous ulcer of ankle, left (HCC)   Chronic venous insufficiency  Gram-negative rod bacteremia Purulent cellulitis of left lower extremity Severe sepsis secondary to above Sepsis criteria met with tachycardia pulse rate 100 and leukocytosis 12.6.  Severe sepsis criteria met with elevated lactic acid to 2.0.  Surprisingly blood cultures positive for gram-negative rods.  Species pending.  Suspect urinary source.  Do not suspect cellulitis source for gram-negative bacteremia. Plan: Continue and Rocephin for today Continue IVF Follow cultures, gram-negative rods, species pending Monitor vitals and fever curve    Paroxysmal atrial fibrillation Chronic anticoagulation Patient is  reasonably well rate controlled Plan: Continue home diltiazem and metoprolol Warfarin with pharmacy dosing assistance   Essential hypertension Cardizem and metoprolol as above   Hyperlipidemia PTA Lipitor 10 mg daily   Obesity Criteria met with BMI 38 with 1 or more chronic comorbidities.  This complicates overall care and prognosis.   DVT prophylaxis: Warfarin Code Status: Full Family Communication: son [name] [phone] on 5/7 Disposition Plan: Status is: Observation The patient will require care spanning > 2 midnights and should be moved to inpatient because: Gram-negative rod bacteremia and sepsis on IV antibiotics   Level of care: Med-Surg  Consultants:  None  Procedures:  None  Antimicrobials: Rocephin Vancomycin    Subjective: Seen and examined.  Appears comfortable.  Pain and drainage from affected left lower extremity is improving.  Objective: Vitals:   09/04/22 0250 09/04/22 0600 09/04/22 0655 09/04/22 0810  BP: (!) 131/48 123/79  (!) 164/87  Pulse: 96 85 87 94  Resp: (!) 24 18 19 16   Temp:  99 F (37.2 C)  98.5 F (36.9 C)  TempSrc:    Oral  SpO2: 92% 92% 95% 96%  Weight:      Height:        Intake/Output Summary (Last 24 hours) at 09/04/2022 1025 Last data filed at 09/03/2022 1844 Gross per 24 hour  Intake 2500 ml  Output --  Net 2500 ml   Filed Weights   09/03/22 1222  Weight: 98.9 kg    Examination:  General exam: Appears calm and comfortable  Respiratory system: Clear to auscultation. Respiratory effort normal. Cardiovascular system: S1-S2, RRR, no murmurs, no pedal edema Gastrointestinal system: Obese, soft, NT/ND, normal bowel  sounds Central nervous system: Alert and oriented. No focal neurological deficits. Extremities: Symmetric 5 x 5 power. Skin: Erythema right shin.  4 cm draining ulcer. Psychiatry: Judgement and insight appear normal. Mood & affect appropriate.     Data Reviewed: I have personally reviewed following labs  and imaging studies  CBC: Recent Labs  Lab 09/03/22 1229 09/04/22 0256  WBC 12.6* 14.0*  NEUTROABS 11.4*  --   HGB 14.1 12.1  HCT 41.8 36.6  MCV 88.9 90.4  PLT 208 162   Basic Metabolic Panel: Recent Labs  Lab 09/03/22 1229 09/04/22 0256  NA 137 136  K 3.8 3.5  CL 104 107  CO2 21* 21*  GLUCOSE 147* 113*  BUN 15 14  CREATININE 0.80 0.71  CALCIUM 8.8* 7.8*   GFR: Estimated Creatinine Clearance: 68.1 mL/min (by C-G formula based on SCr of 0.71 mg/dL). Liver Function Tests: Recent Labs  Lab 09/03/22 1229  AST 37  ALT 19  ALKPHOS 73  BILITOT 2.0*  PROT 7.0  ALBUMIN 3.6   No results for input(s): "LIPASE", "AMYLASE" in the last 168 hours. No results for input(s): "AMMONIA" in the last 168 hours. Coagulation Profile: Recent Labs  Lab 09/03/22 1550 09/04/22 0256  INR 2.0* 2.3*   Cardiac Enzymes: No results for input(s): "CKTOTAL", "CKMB", "CKMBINDEX", "TROPONINI" in the last 168 hours. BNP (last 3 results) No results for input(s): "PROBNP" in the last 8760 hours. HbA1C: No results for input(s): "HGBA1C" in the last 72 hours. CBG: No results for input(s): "GLUCAP" in the last 168 hours. Lipid Profile: No results for input(s): "CHOL", "HDL", "LDLCALC", "TRIG", "CHOLHDL", "LDLDIRECT" in the last 72 hours. Thyroid Function Tests: No results for input(s): "TSH", "T4TOTAL", "FREET4", "T3FREE", "THYROIDAB" in the last 72 hours. Anemia Panel: No results for input(s): "VITAMINB12", "FOLATE", "FERRITIN", "TIBC", "IRON", "RETICCTPCT" in the last 72 hours. Sepsis Labs: Recent Labs  Lab 09/03/22 1229 09/03/22 1600  LATICACIDVEN 2.0* 2.3*    Recent Results (from the past 240 hour(s))  Blood culture (routine x 2)     Status: None (Preliminary result)   Collection Time: 09/03/22  4:00 PM   Specimen: BLOOD  Result Value Ref Range Status   Specimen Description BLOOD BLOOD RIGHT FOREARM  Final   Special Requests   Final    BOTTLES DRAWN AEROBIC AND ANAEROBIC Blood  Culture adequate volume   Culture  Setup Time   Final    GRAM NEGATIVE RODS IN BOTH AEROBIC AND ANAEROBIC BOTTLES CRITICAL VALUE NOTED.  VALUE IS CONSISTENT WITH PREVIOUSLY REPORTED AND CALLED VALUE. Performed at [facility], [address] [place], [place] [address]    Culture GRAM NEGATIVE RODS  Final   Report Status PENDING  Incomplete  Blood culture (routine x 2)     Status: None (Preliminary result)   Collection Time: 09/03/22  4:00 PM   Specimen: BLOOD  Result Value Ref Range Status   Specimen Description BLOOD RIGHT ANTECUBITAL  Final   Special Requests   Final    BOTTLES DRAWN AEROBIC AND ANAEROBIC Blood Culture adequate volume   Culture  Setup Time   Final    GRAM NEGATIVE RODS IN BOTH AEROBIC AND ANAEROBIC BOTTLES Organism ID to follow CRITICAL RESULT CALLED TO, READ BACK BY AND VERIFIED WITH: [email or web address]  09/04/22 RH Performed at [facility], [address] [place], [place] [address]    Culture GRAM NEGATIVE RODS  Final   Report Status PENDING  Incomplete  Blood Culture ID Panel (Reflexed)  Status: None   Collection Time: 09/03/22  4:00 PM  Result Value Ref Range Status   Enterococcus faecalis NOT DETECTED NOT DETECTED Final   Enterococcus Faecium NOT DETECTED NOT DETECTED Final   Listeria monocytogenes NOT DETECTED NOT DETECTED Final   Staphylococcus species NOT DETECTED NOT DETECTED Final   Staphylococcus aureus (BCID) NOT DETECTED NOT DETECTED Final   Staphylococcus epidermidis NOT DETECTED NOT DETECTED Final   Staphylococcus lugdunensis NOT DETECTED NOT DETECTED Final   Streptococcus species NOT DETECTED NOT DETECTED Final   Streptococcus agalactiae NOT DETECTED NOT DETECTED Final   Streptococcus pneumoniae NOT DETECTED NOT DETECTED Final   Streptococcus pyogenes NOT DETECTED NOT DETECTED Final   A.calcoaceticus-baumannii NOT DETECTED NOT DETECTED Final   Bacteroides fragilis NOT DETECTED NOT DETECTED Final   Enterobacterales NOT  DETECTED NOT DETECTED Final   Enterobacter cloacae complex NOT DETECTED NOT DETECTED Final   Escherichia coli NOT DETECTED NOT DETECTED Final   Klebsiella aerogenes NOT DETECTED NOT DETECTED Final   Klebsiella oxytoca NOT DETECTED NOT DETECTED Final   Klebsiella pneumoniae NOT DETECTED NOT DETECTED Final   Proteus species NOT DETECTED NOT DETECTED Final   Salmonella species NOT DETECTED NOT DETECTED Final   Serratia marcescens NOT DETECTED NOT DETECTED Final   Haemophilus influenzae NOT DETECTED NOT DETECTED Final   Neisseria meningitidis NOT DETECTED NOT DETECTED Final   Pseudomonas aeruginosa NOT DETECTED NOT DETECTED Final   Stenotrophomonas maltophilia NOT DETECTED NOT DETECTED Final   Candida albicans NOT DETECTED NOT DETECTED Final   Candida auris NOT DETECTED NOT DETECTED Final   Candida glabrata NOT DETECTED NOT DETECTED Final   Candida krusei NOT DETECTED NOT DETECTED Final   Candida parapsilosis NOT DETECTED NOT DETECTED Final   Candida tropicalis NOT DETECTED NOT DETECTED Final   Cryptococcus neoformans/gattii NOT DETECTED NOT DETECTED Final    Comment: Performed at [facility], [address] [place], [place] [address]         Radiology Studies: DG Chest 2 View  Result Date: 09/03/2022 CLINICAL DATA:  History of leg cellulitis with fevers and chills EXAM: CHEST - 2 VIEW COMPARISON:  X-ray 01/17/2017 FINDINGS: Calcified aorta. Normal cardiopericardial silhouette. No pneumothorax, effusion or consolidation. Interstitial changes noted bilaterally which are increased from the previous examination. Acute versus chronic. Recommend short follow-up. Degenerative changes of the spine IMPRESSION: New diffuse scattered interstitial prominence. Acute versus chronic. Please correlate with symptoms and short follow-up is recommended. No effusion or consolidation Electronically Signed   By: Karen Kays M.D.   On: 09/03/2022 13:15        Scheduled Meds:  atorvastatin  10  mg Oral Daily   diltiazem  240 mg Oral BH-q7a   enoxaparin (LOVENOX) injection  0.5 mg/kg Subcutaneous Q24H   famotidine  20 mg Oral BH-q7a   metoprolol tartrate  25 mg Oral BID   warfarin  2 mg Oral ONCE-1600   Warfarin - Pharmacist Dosing Inpatient   Does not apply q1600   Continuous Infusions:  cefTRIAXone (ROCEPHIN)  IV     lactated ringers 75 mL/hr at 09/03/22 1900   vancomycin       LOS: 0 days     Tresa Moore, MD [facility]   If 7PM-7AM, please contact night-coverage  09/04/2022, 10:25 AM

## 2022-09-04 NOTE — Progress Notes (Signed)
PHARMACY - PHYSICIAN COMMUNICATION CRITICAL VALUE ALERT - BLOOD CULTURE IDENTIFICATION (BCID)  Brandi Poole is an 77 y.o. female who presented to [facility] on 09/03/2022 with a chief complaint of cellulitis of lower extremity  Assessment:  Gram neg rods in 4 of 4 bottles,  exact species not recognized, sample sent to [facility] for further ID (include suspected source if known)  Name of physician (or Provider) Contacted:  Bishop Limbo, NP  Current antibiotics: Ceftriaxone 2 gm IV Q24H , Vancomycin 1 gm IV Q24H   Changes to prescribed antibiotics recommended:  Patient is on recommended antibiotics - No changes needed -  suggested possibly d/c Vanc, NP wants to continue current regimen   Results for orders placed or performed during the hospital encounter of 09/03/22  Blood Culture ID Panel (Reflexed) (Collected: 09/03/2022  4:00 PM)  Result Value Ref Range   Enterococcus faecalis NOT DETECTED NOT DETECTED   Enterococcus Faecium NOT DETECTED NOT DETECTED   Listeria monocytogenes NOT DETECTED NOT DETECTED   Staphylococcus species NOT DETECTED NOT DETECTED   Staphylococcus aureus (BCID) NOT DETECTED NOT DETECTED   Staphylococcus epidermidis NOT DETECTED NOT DETECTED   Staphylococcus lugdunensis NOT DETECTED NOT DETECTED   Streptococcus species NOT DETECTED NOT DETECTED   Streptococcus agalactiae NOT DETECTED NOT DETECTED   Streptococcus pneumoniae NOT DETECTED NOT DETECTED   Streptococcus pyogenes NOT DETECTED NOT DETECTED   A.calcoaceticus-baumannii NOT DETECTED NOT DETECTED   Bacteroides fragilis NOT DETECTED NOT DETECTED   Enterobacterales NOT DETECTED NOT DETECTED   Enterobacter cloacae complex NOT DETECTED NOT DETECTED   Escherichia coli NOT DETECTED NOT DETECTED   Klebsiella aerogenes NOT DETECTED NOT DETECTED   Klebsiella oxytoca NOT DETECTED NOT DETECTED   Klebsiella pneumoniae NOT DETECTED NOT DETECTED   Proteus species NOT DETECTED NOT DETECTED   Salmonella species NOT DETECTED  NOT DETECTED   Serratia marcescens NOT DETECTED NOT DETECTED   Haemophilus influenzae NOT DETECTED NOT DETECTED   Neisseria meningitidis NOT DETECTED NOT DETECTED   Pseudomonas aeruginosa NOT DETECTED NOT DETECTED   Stenotrophomonas maltophilia NOT DETECTED NOT DETECTED   Candida albicans NOT DETECTED NOT DETECTED   Candida auris NOT DETECTED NOT DETECTED   Candida glabrata NOT DETECTED NOT DETECTED   Candida krusei NOT DETECTED NOT DETECTED   Candida parapsilosis NOT DETECTED NOT DETECTED   Candida tropicalis NOT DETECTED NOT DETECTED   Cryptococcus neoformans/gattii NOT DETECTED NOT DETECTED    [name] D 09/04/2022  5:59 AM

## 2022-09-04 NOTE — Care Management (Signed)
Blood cultures positive 4 out of 4 for gram-negative rods.  Interestingly the [facility] ID is negative.  Unknown nature of the bacteremia.  Patient does have cats also question the possibility of Pasteurella however she denies any recent scratches or bites.  I have consulted infectious disease for recommendations.

## 2022-09-05 ENCOUNTER — Inpatient Hospital Stay (HOSPITAL_COMMUNITY)
Admit: 2022-09-05 | Discharge: 2022-09-05 | Disposition: A | Discharge status: Home / Self Care | Payer: 59 | Attending: Infectious Diseases | Admitting: Infectious Diseases

## 2022-09-05 DIAGNOSIS — R7881 Bacteremia: Secondary | ICD-10-CM | POA: Diagnosis not present

## 2022-09-05 DIAGNOSIS — A280 Pasteurellosis: Secondary | ICD-10-CM

## 2022-09-05 DIAGNOSIS — I4811 Longstanding persistent atrial fibrillation: Secondary | ICD-10-CM | POA: Diagnosis not present

## 2022-09-05 DIAGNOSIS — S81802A Unspecified open wound, left lower leg, initial encounter: Secondary | ICD-10-CM

## 2022-09-05 DIAGNOSIS — L03116 Cellulitis of left lower limb: Secondary | ICD-10-CM | POA: Diagnosis not present

## 2022-09-05 LAB — CBC WITH DIFFERENTIAL/PLATELET
Abs Immature Granulocytes: 0.03 10*3/uL (ref 0.00–0.07)
Basophils Absolute: 0.1 10*3/uL (ref 0.0–0.1)
Basophils Relative: 1 %
Eosinophils Absolute: 0.3 10*3/uL (ref 0.0–0.5)
Eosinophils Relative: 3 %
HCT: 38.9 % (ref 36.0–46.0)
Hemoglobin: 12.9 g/dL (ref 12.0–15.0)
Immature Granulocytes: 0 %
Lymphocytes Relative: 19 %
Lymphs Abs: 1.6 10*3/uL (ref 0.7–4.0)
MCH: 29.4 pg (ref 26.0–34.0)
MCHC: 33.2 g/dL (ref 30.0–36.0)
MCV: 88.6 fL (ref 80.0–100.0)
Monocytes Absolute: 0.4 10*3/uL (ref 0.1–1.0)
Monocytes Relative: 5 %
Neutro Abs: 6 10*3/uL (ref 1.7–7.7)
Neutrophils Relative %: 72 %
Platelets: 170 10*3/uL (ref 150–400)
RBC: 4.39 MIL/uL (ref 3.87–5.11)
RDW: 14.7 % (ref 11.5–15.5)
WBC: 8.5 10*3/uL (ref 4.0–10.5)
nRBC: 0 % (ref 0.0–0.2)

## 2022-09-05 LAB — BASIC METABOLIC PANEL
Anion gap: 8 (ref 5–15)
BUN: 11 mg/dL (ref 8–23)
CO2: 22 mmol/L (ref 22–32)
Calcium: 8.5 mg/dL — ABNORMAL LOW (ref 8.9–10.3)
Chloride: 108 mmol/L (ref 98–111)
Creatinine, Ser: 0.79 mg/dL (ref 0.44–1.00)
GFR, Estimated: >60 mL/min (ref >60)
Glucose, Bld: 172 mg/dL — ABNORMAL HIGH (ref 70–99)
Potassium: 3.5 mmol/L (ref 3.5–5.1)
Sodium: 138 mmol/L (ref 135–145)

## 2022-09-05 LAB — ECHOCARDIOGRAM COMPLETE
AV Mean grad: 3 mmHg
AV Peak grad: 4.7 mmHg
Ao pk vel: 1.09 m/s
Area-P 1/2: 3.31 cm2
Height: 63 in
Weight: 3488 oz

## 2022-09-05 LAB — PROTIME-INR
INR: 2.4 — ABNORMAL HIGH (ref 0.8–1.2)
Prothrombin Time: 26 seconds — ABNORMAL HIGH (ref 11.4–15.2)

## 2022-09-05 LAB — AEROBIC CULTURE W GRAM STAIN (SUPERFICIAL SPECIMEN): Culture: NO GROWTH

## 2022-09-05 LAB — CULTURE, BLOOD (ROUTINE X 2)

## 2022-09-05 MED ORDER — SODIUM CHLORIDE 0.9 % IV SOLN
3.0000 g | Freq: Four times a day (QID) | INTRAVENOUS | Status: DC
  Administered 2022-09-05 – 2022-09-07 (×8): 3 g via INTRAVENOUS
  Filled 2022-09-05: qty 3
  Filled 2022-09-05: qty 8
  Filled 2022-09-05: qty 3
  Filled 2022-09-05: qty 8
  Filled 2022-09-05: qty 3
  Filled 2022-09-05 (×5): qty 8

## 2022-09-05 MED ORDER — WARFARIN SODIUM 2 MG PO TABS
2.0000 mg | ORAL_TABLET | Freq: Once | ORAL | Status: AC
  Administered 2022-09-05: 2 mg via ORAL
  Filled 2022-09-05: qty 1

## 2022-09-05 NOTE — Progress Notes (Signed)
Progress Note   Patient: Brandi Poole AVW:[id number] DOB: 1945/08/24 DOA: 09/03/2022     1 DOS: the patient was seen and examined on 09/05/2022   Brief hospital course: 77 y.o. female with medical history significant for hypertension, diabetes, A-fib on anticoagulation with warfarin presents with leg ulcer.  Patient has a history of chronic venous insufficiency.  Patient has a history of ulcers in the past.  Does not see anyone regularly for this.  Patient endorses worsening leg ulcer over the past 2 weeks which progressed to drainage from the wound associated with warmth and pain.  No evidence of trauma.  Good pedal pulses on affected extremity.   On my evaluation patient is resting comfortably in bed.  She is in no visible distress.  She answers all questions appropriately.  She does have a exam significant for erythema of left shin associated with a draining ulcer on the anterior aspect.  All vital signs stable.   5/7: Patient remained stable overnight. Blood cultures positive for gram-negative rods both aerobic and anaerobic bottles.   5/8 : Blood cultures positive for Pasteurella multocida       Assessment and Plan:  Pasteurella multi seeded bacteremia Purulent cellulitis of left lower extremity Severe sepsis secondary to above Sepsis criteria met with tachycardia pulse rate 100 and leukocytosis 12.6 and elevated lactic acid to 2.0.   Blood cultures yielded Pasteurella multocida Will start patient on Unasyn Appreciate ID input    Paroxysmal atrial fibrillation Chronic anticoagulation Continue home diltiazem and metoprolol for rate control Patient is on warfarin with a therapeutic INR   Essential hypertension Blood pressure is stable on Cardizem and metoprolol   Hyperlipidemia Continue Lipitor 10 mg daily   Obesity Criteria met with BMI 38 with 1 or more chronic comorbidities.  This complicates overall care and prognosis   Mild Cognitive impairment Patient is  oriented to person and place but not to time Son confirms that he thinks she may have dementia but he has not been officially diagnosed Will require assistance with some ADLs.       Subjective: Patient is seen and examined at the bedside.  She is sitting up in a recliner and has no new complaints.  Physical Exam: Vitals:   09/04/22 1456 09/04/22 2047 09/05/22 0428 09/05/22 0731  BP: (!) 141/69 (!) 155/88 120/68 127/79  Pulse: 67 93 72 81  Resp: 18 18 20 16   Temp: 97.9 F (36.6 C) 97.8 F (36.6 C) 97.6 F (36.4 C) 97.8 F (36.6 C)  TempSrc:      SpO2: 96% 98% 97% 94%  Weight:      Height:       Physical Exam Vitals and nursing note reviewed.  Constitutional:      Appearance: She is obese.  HENT:     Head: Normocephalic and atraumatic.     Nose: Nose normal.     Mouth/Throat:     Mouth: Mucous membranes are moist.  Eyes:     Conjunctiva/sclera: Conjunctivae normal.  Cardiovascular:     Rate and Rhythm: Normal rate and regular rhythm.  Pulmonary:     Effort: Pulmonary effort is normal.     Breath sounds: Normal breath sounds.  Abdominal:     General: Bowel sounds are normal.     Palpations: Abdomen is soft.     Comments: Central adiposity  Musculoskeletal:        General: Normal range of motion.     Cervical back: Normal range of motion  and neck supple.     Right lower leg: Edema present.     Left lower leg: Edema present.  Skin:    Comments: Deroofed blister over left lower extremity  Neurological:     General: No focal deficit present.     Mental Status: She is alert.  Psychiatric:        Mood and Affect: Mood normal.        Behavior: Behavior normal.      Data Reviewed: Relevant notes from primary care and specialist visits, past discharge summaries as available in EHR, including Care Everywhere. Prior diagnostic testing as pertinent to current admission diagnoses Updated medications and problem lists for reconciliation ED course, including vitals,  labs, imaging, treatment and response to treatment Triage notes, nursing and pharmacy notes and ED provider's notes Notable results as noted in HPI Labs reviewed.  Within normal limits There are no new results to review at this time.  Family Communication: Greater than 50% of time was spent discussing patient's condition and plan of care with her son Hooriya Hummingbird over the phone.  All questions and concerns have been addressed.  He verbalizes understanding and agrees to the plan.  Disposition: Status is: Inpatient Remains inpatient appropriate because: Requires IV antibiotics for gram-negative bacteremia  Planned Discharge Destination: Home    Time spent: 35 minutes  Author: Lucile Shutters, MD 09/05/2022 11:58 AM  For on call review [email or web address].

## 2022-09-05 NOTE — Progress Notes (Signed)
  Transition of Care [facility]) Screening Note   Patient Details  Name: CICI FRINGER Date of Birth: 01/16/1946   Transition of Care [facility]) CM/SW Contact:    Chapman Fitch, RN Phone Number: 09/05/2022, 9:28 AM    Transition of Care Department [facility]) has reviewed patient and no TOC needs have been identified at this time. We will continue to monitor patient advancement through interdisciplinary progression rounds. If new patient transition needs arise, please place a TOC consult.

## 2022-09-05 NOTE — Progress Notes (Signed)
Date of Admission:  09/03/2022    ID: Brandi Poole is a 76 y.o. female  Principal Problem:   Cellulitis of lower extremity Active Problems:   Essential hypertension   Atrial fibrillation (HCC)   Hyperlipidemia   Venous ulcer of ankle, left (HCC)   Chronic venous insufficiency   Severe sepsis with lactic acidosis (HCC)   Subjective Says she is feeling fine She has a sitter in the room as she has been roaming around and trying to remove IV line.   Medications:   atorvastatin  10 mg Oral Daily   diltiazem  240 mg Oral BH-q7a   famotidine  20 mg Oral BH-q7a   metoprolol tartrate  25 mg Oral BID   QUEtiapine  25 mg Oral QHS   warfarin  2 mg Oral ONCE-1600   Warfarin - Pharmacist Dosing Inpatient   Does not apply q1600    Objective: Vital signs in last 24 hours: Patient Vitals for the past 24 hrs:  BP Temp Pulse Resp SpO2  09/05/22 0731 127/79 97.8 F (36.6 C) 81 16 94 %  09/05/22 0428 120/68 97.6 F (36.4 C) 72 20 97 %  09/04/22 2047 (!) 155/88 97.8 F (36.6 C) 93 18 98 %  09/04/22 1456 (!) 141/69 97.9 F (36.6 C) 67 18 96 %      PHYSICAL EXAM:  General: Alert, cooperative, no distress, appears stated age.  Head: Normocephalic, without obvious abnormality, atraumatic. Eyes: Conjunctivae clear, anicteric sclerae. Pupils are equal ENT Nares normal. No drainage or sinus tenderness. Lips, mucosa, and tongue normal. No Thrush Neck: Supple, symmetrical, no adenopathy, thyroid: non tender no carotid bruit and no JVD. Back: No CVA tenderness. Lungs: Clear to auscultation bilaterally. No Wheezing or Rhonchi. No rales. Heart: Regular rate and rhythm, no murmur, rub or gallop. Abdomen: Soft, non-tender,not distended. Bowel sounds normal. No masses Extremities: edema left leg >> rt Erythema left leg Deroofed blister Skin: No rashes or lesions. Or bruising Lymph: Cervical, supraclavicular normal. Neurologic: Grossly non-focal  Lab Results    Latest Ref Rng & Units  09/05/2022    9:27 AM 09/04/2022    2:56 AM 09/03/2022   12:29 PM  CBC  WBC 4.0 - 10.5 K/uL 8.5  14.0  12.6   Hemoglobin 12.0 - 15.0 g/dL 16.1  09.6  04.5   Hematocrit 36.0 - 46.0 % 38.9  36.6  41.8   Platelets 150 - 400 K/uL 170  162  208        Latest Ref Rng & Units 09/05/2022    9:27 AM 09/04/2022    2:56 AM 09/03/2022   12:29 PM  CMP  Glucose 70 - 99 mg/dL 409  811  914   BUN 8 - 23 mg/dL 11  14  15    Creatinine 0.44 - 1.00 mg/dL 7.82  9.56  2.13   Sodium 135 - 145 mmol/L 138  136  137   Potassium 3.5 - 5.1 mmol/L 3.5  3.5  3.8   Chloride 98 - 111 mmol/L 108  107  104   CO2 22 - 32 mmol/L 22  21  21    Calcium 8.9 - 10.3 mg/dL 8.5  7.8  8.8   Total Protein 6.5 - 8.1 g/dL   7.0   Total Bilirubin 0.3 - 1.2 mg/dL   2.0   Alkaline Phos 38 - 126 U/L   73   AST 15 - 41 U/L   37   ALT 0 - 44 U/L  19       Microbiology: Bc 4/4 pastuerella Studies/Results: CT HEAD WO CONTRAST ( )  Result Date: 09/04/2022 CLINICAL DATA:  Altered mental status EXAM: CT HEAD WITHOUT CONTRAST TECHNIQUE: Contiguous axial images were obtained from the base of the skull through the vertex without intravenous contrast. RADIATION DOSE REDUCTION: This exam was performed according to the departmental dose-optimization program which includes automated exposure control, adjustment of the mA and/or kV according to patient size and/or use of iterative reconstruction technique. COMPARISON:  None Available. FINDINGS: Brain: There is no mass, hemorrhage or extra-axial collection. The size and configuration of the ventricles and extra-axial CSF spaces are normal. There is hypoattenuation of the white matter, most commonly indicating chronic small vessel disease. Vascular: No abnormal hyperdensity of the major intracranial arteries or dural venous sinuses. No intracranial atherosclerosis. Skull: The visualized skull base, calvarium and extracranial soft tissues are normal. Sinuses/Orbits: No fluid levels or advanced mucosal  thickening of the visualized paranasal sinuses. No mastoid or middle ear effusion. The orbits are normal. IMPRESSION: Chronic small vessel disease without acute intracranial abnormality. Electronically Signed   By: Deatra Robinson M.D.   On: 09/04/2022 20:01   DG Chest 2 View  Result Date: 09/03/2022 CLINICAL DATA:  History of leg cellulitis with fevers and chills EXAM: CHEST - 2 VIEW COMPARISON:  X-ray 01/17/2017 FINDINGS: Calcified aorta. Normal cardiopericardial silhouette. No pneumothorax, effusion or consolidation. Interstitial changes noted bilaterally which are increased from the previous examination. Acute versus chronic. Recommend short follow-up. Degenerative changes of the spine IMPRESSION: New diffuse scattered interstitial prominence. Acute versus chronic. Please correlate with symptoms and short follow-up is recommended. No effusion or consolidation Electronically Signed   By: Karen Kays M.D.   On: 09/03/2022 13:15     Assessment/Plan: Pasteurella multocida bacteremia 2 d echo  Repeat blood culture On zosyn- will need minimum 2 weeks of antibiotic- will decide on route soon  Chronic venous edema legs Venous ulceration  Chronic afib   Discussed the management with the patient

## 2022-09-05 NOTE — Consult Note (Signed)
ANTICOAGULATION CONSULT NOTE  Pharmacy Consult for warfarin dosing Indication: atrial fibrillation  No Known Allergies  Patient Measurements: Height: 5\' 3"  (160 cm) Weight: 98.9 kg (218 lb) IBW/kg (Calculated) : 52.4   Vital Signs: Temp: 97.8 F (36.6 C) (05/08 0731) BP: 127/79 (05/08 0731) Pulse Rate: 81 (05/08 0731)  Labs: Recent Labs    09/03/22 1229 09/03/22 1550 09/04/22 0256  HGB 14.1  --  12.1  HCT 41.8  --  36.6  PLT 208  --  162  LABPROT  --  22.2* 24.8*  INR  --  2.0* 2.3*  CREATININE 0.80  --  0.71     Estimated Creatinine Clearance: 68.1 mL/min (by C-G formula based on SCr of 0.71 mg/dL).   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  Reported last dose was 09/02/22 @ 1600.  Assessment: 77 yo female presented to the ED with fever, left leg pain and drainage from lower extremity ulcer.  Admitted for treatment of cellulitis with IV antibiotics. Patient has history of Afib currently on Warfarin.  Pharmacy has been consulted to manage warfarin dosing.  Interacting medication: Amiodarone  Goal of Therapy:  INR 2-3 Monitor platelets by anticoagulation protocol: Yes   Date INR Warfarin Dose  05/06 2.0 4 mg  05/07 2.3 2mg   05/08 2.4 2 mg         Plan:  INR remains therapeutic at 2.4 Will give warfarin 2 mg po x 1 tonight Daily INR while inpatient CBC at least every 72 hours.  Elliot Gurney, PharmD, BCPS Clinical Pharmacist  09/05/2022 8:41 AM

## 2022-09-05 NOTE — Progress Notes (Signed)
*  PRELIMINARY RESULTS* Echocardiogram 2D Echocardiogram has been performed.  Cristela Blue 09/05/2022, 2:18 PM

## 2022-09-06 ENCOUNTER — Ambulatory Visit (INDEPENDENT_AMBULATORY_CARE_PROVIDER_SITE_OTHER): Payer: 59 | Admitting: Vascular Surgery

## 2022-09-06 DIAGNOSIS — L03116 Cellulitis of left lower limb: Secondary | ICD-10-CM | POA: Diagnosis not present

## 2022-09-06 LAB — CULTURE, BLOOD (ROUTINE X 2): Special Requests: ADEQUATE

## 2022-09-06 LAB — AEROBIC CULTURE W GRAM STAIN (SUPERFICIAL SPECIMEN): Gram Stain: NONE SEEN

## 2022-09-06 LAB — PROTIME-INR
INR: 2.3 — ABNORMAL HIGH (ref 0.8–1.2)
Prothrombin Time: 25.8 seconds — ABNORMAL HIGH (ref 11.4–15.2)

## 2022-09-06 MED ORDER — WARFARIN SODIUM 2 MG PO TABS
2.0000 mg | ORAL_TABLET | Freq: Once | ORAL | Status: AC
  Administered 2022-09-06: 2 mg via ORAL
  Filled 2022-09-06: qty 1

## 2022-09-06 NOTE — Progress Notes (Addendum)
Date of Admission:  09/03/2022    ID: Brandi Poole is a 77 y.o. female  Principal Problem:   Cellulitis of lower extremity Active Problems:   Essential hypertension   Atrial fibrillation (HCC)   Hyperlipidemia   Venous ulcer of ankle, left (HCC)   Chronic venous insufficiency   Severe sepsis with lactic acidosis (HCC)   Pasteurella infection   Gram-negative bacteremia   Wound of left leg   Subjective Feeling better Sitting in chair Leg swelling better   Medications:   atorvastatin  10 mg Oral Daily   diltiazem  240 mg Oral BH-q7a   famotidine  20 mg Oral BH-q7a   metoprolol tartrate  25 mg Oral BID   QUEtiapine  25 mg Oral QHS   warfarin  2 mg Oral ONCE-1600   Warfarin - Pharmacist Dosing Inpatient   Does not apply q1600    Objective: Vital signs in last 24 hours: Patient Vitals for the past 24 hrs:  BP Temp Temp src Pulse Resp SpO2  09/06/22 0858 (!) 141/72 98 F (36.7 C) Oral 76 20 98 %  09/06/22 0851 (!) 141/72 -- -- 89 -- --  09/06/22 0425 132/73 97.8 F (36.6 C) -- 61 20 96 %  09/05/22 1927 (!) 163/80 98 F (36.7 C) Oral 78 18 98 %  09/05/22 1606 (!) 157/76 97.7 F (36.5 C) -- 64 20 97 %      PHYSICAL EXAM:  General: Alert, cooperative, no distress, intermittently confused  Lungs: Clear to auscultation bilaterally. No Wheezing or Rhonchi. No rales. Heart: Regular rate and rhythm, no murmur, rub or gallop. Abdomen: Soft, non-tender,not distended. Bowel sounds normal. No masses Extremities: edema left leg >> rt Erythema left leg much improved Deroofed blister Skin: No rashes or lesions. Or bruising Lymph: Cervical, supraclavicular normal. Neurologic: Grossly non-focal  Lab Results    Latest Ref Rng & Units 09/05/2022    9:27 AM 09/04/2022    2:56 AM 09/03/2022   12:29 PM  CBC  WBC 4.0 - 10.5 K/uL 8.5  14.0  12.6   Hemoglobin 12.0 - 15.0 g/dL 16.1  09.6  04.5   Hematocrit 36.0 - 46.0 % 38.9  36.6  41.8   Platelets 150 - 400 K/uL 170  162  208         Latest Ref Rng & Units 09/05/2022    9:27 AM 09/04/2022    2:56 AM 09/03/2022   12:29 PM  CMP  Glucose 70 - 99 mg/dL 409  811  914   BUN 8 - 23 mg/dL 11  14  15    Creatinine 0.44 - 1.00 mg/dL 7.82  9.56  2.13   Sodium 135 - 145 mmol/L 138  136  137   Potassium 3.5 - 5.1 mmol/L 3.5  3.5  3.8   Chloride 98 - 111 mmol/L 108  107  104   CO2 22 - 32 mmol/L 22  21  21    Calcium 8.9 - 10.3 mg/dL 8.5  7.8  8.8   Total Protein 6.5 - 8.1 g/dL   7.0   Total Bilirubin 0.3 - 1.2 mg/dL   2.0   Alkaline Phos 38 - 126 U/L   73   AST 15 - 41 U/L   37   ALT 0 - 44 U/L   19       Microbiology: Bc 4/4 pastuerella Studies/Results: ECHOCARDIOGRAM COMPLETE  Result Date: 09/05/2022    ECHOCARDIOGRAM REPORT   Patient Name:  Brandi Poole Date of Exam: 09/05/2022 Medical Rec #:  [id number]         Height:       63.0 in Accession #:    [id number]        Weight:       218.0 lb Date of Birth:  [birth year]         BSA:          2.006 m Patient Age:    76 years          BP:           127/79 mmHg Patient Gender: F                 HR:           81 bpm. Exam Location:  [facility] Procedure: 2D Echo, Cardiac Doppler and Color Doppler Indications:     Bacteremia R78.81  History:         Patient has no prior history of Echocardiogram examinations.                  Arrythmias:Atrial Fibrillation; Risk Factors:Diabetes,                  Hypertension and Dyslipidemia.  Sonographer:     Cristela Blue Referring Phys:  [id number] Lynn Ito Diagnosing Phys: Debbe Odea MD  Sonographer Comments: Suboptimal apical window. Apicals are Off axis. IMPRESSIONS  1. Left ventricular ejection fraction, by estimation, is 55 to 60%. The left ventricle has normal function. The left ventricle has no regional wall motion abnormalities. There is mild left ventricular hypertrophy. Left ventricular diastolic parameters are indeterminate.  2. Right ventricular systolic function is normal. The right ventricular size is normal.  3. Left atrial  size was mildly dilated.  4. Right atrial size was mildly dilated.  5. The mitral valve is normal in structure. Mild mitral valve regurgitation.  6. The aortic valve is tricuspid. Aortic valve regurgitation is not visualized. FINDINGS  Left Ventricle: Left ventricular ejection fraction, by estimation, is 55 to 60%. The left ventricle has normal function. The left ventricle has no regional wall motion abnormalities. The left ventricular internal cavity size was normal in size. There is  mild left ventricular hypertrophy. Left ventricular diastolic parameters are indeterminate. Right Ventricle: The right ventricular size is normal. No increase in right ventricular wall thickness. Right ventricular systolic function is normal. Left Atrium: Left atrial size was mildly dilated. Right Atrium: Right atrial size was mildly dilated. Pericardium: There is no evidence of pericardial effusion. Mitral Valve: The mitral valve is normal in structure. Mild mitral valve regurgitation. MV peak gradient, 6.9 mmHg. The mean mitral valve gradient is 2.0 mmHg. Tricuspid Valve: The tricuspid valve is normal in structure. Tricuspid valve regurgitation is mild. Aortic Valve: The aortic valve is tricuspid. Aortic valve regurgitation is not visualized. Aortic valve mean gradient measures 3.0 mmHg. Aortic valve peak gradient measures 4.7 mmHg. Pulmonic Valve: The pulmonic valve was not well visualized. Pulmonic valve regurgitation is not visualized. Aorta: The aortic root is normal in size and structure. IAS/Shunts: No atrial level shunt detected by color flow Doppler.   Diastology LV e' medial:    15.20 cm/s LV E/e' medial:  5.6 LV e' lateral:   10.30 cm/s LV E/e' lateral: 8.3  RIGHT VENTRICLE RV Basal diam:  3.40 cm RV Mid diam:    3.40 cm LEFT ATRIUM           Index  RIGHT ATRIUM           Index LA Vol (A4C): 26.2 ml 13.06 ml/m  RA Area:     21.70 cm                                    RA Volume:   60.30 ml  30.06 ml/m  AORTIC VALVE  AV Vmax:           108.50 cm/s AV Vmean:          72.850 cm/s AV VTI:            0.211 m AV Peak Grad:      4.7 mmHg AV Mean Grad:      3.0 mmHg LVOT Vmax:         83.80 cm/s LVOT Vmean:        51.200 cm/s LVOT VTI:          0.158 m LVOT/AV VTI ratio: 0.75 MITRAL VALVE               TRICUSPID VALVE MV Area (PHT): 3.31 cm    TR Peak grad:   23.2 mmHg MV Peak grad:  6.9 mmHg    TR Vmax:        241.00 cm/s MV Mean grad:  2.0 mmHg MV Vmax:       1.31 m/s    SHUNTS MV Vmean:      54.1 cm/s   Systemic VTI: 0.16 m MV Decel Time: 229 msec MV E velocity: 85.40 cm/s Debbe Odea MD Electronically signed by Debbe Odea MD Signature Date/Time: 09/05/2022/4:30:39 PM    Final    CT HEAD WO CONTRAST ( )  Result Date: 09/04/2022 CLINICAL DATA:  Altered mental status EXAM: CT HEAD WITHOUT CONTRAST TECHNIQUE: Contiguous axial images were obtained from the base of the skull through the vertex without intravenous contrast. RADIATION DOSE REDUCTION: This exam was performed according to the departmental dose-optimization program which includes automated exposure control, adjustment of the mA and/or kV according to patient size and/or use of iterative reconstruction technique. COMPARISON:  None Available. FINDINGS: Brain: There is no mass, hemorrhage or extra-axial collection. The size and configuration of the ventricles and extra-axial CSF spaces are normal. There is hypoattenuation of the white matter, most commonly indicating chronic small vessel disease. Vascular: No abnormal hyperdensity of the major intracranial arteries or dural venous sinuses. No intracranial atherosclerosis. Skull: The visualized skull base, calvarium and extracranial soft tissues are normal. Sinuses/Orbits: No fluid levels or advanced mucosal thickening of the visualized paranasal sinuses. No mastoid or middle ear effusion. The orbits are normal. IMPRESSION: Chronic small vessel disease without acute intracranial abnormality. Electronically Signed    By: Deatra Robinson M.D.   On: 09/04/2022 20:01     Assessment/Plan: Pasteurella multocida bacteremia 2 d echo  Repeat blood culture On unasyn - will need minimum 10-14 D of antibiotic- may be able to switch to High dose augmentin soon 2 d echo valves looked fine  Chronic venous edema legs Venous ulceration  Chronic afib  Early neurocognitive impairment? Discussed the management with the patient

## 2022-09-06 NOTE — TOC Progression Note (Signed)
Transition of Care [facility]) - Progression Note    Patient Details  Name: Brandi Poole MRN: [id number] Date of Birth: January 31, 1946  Transition of [facility] CM/SW Contact  Chapman Fitch, RN Phone Number: 09/06/2022, 9:25 AM  Clinical Narrative:      Per ID "On zosyn- will need minimum 2 weeks of antibiotic- will decide on route soon " Please consult TOC if home IV antibiotics or any other needs arise       Expected Discharge Plan and Services                                               Social Determinants of Health (SDOH) Interventions SDOH Screenings   Food Insecurity: No Food Insecurity (09/04/2022)  Housing: Low Risk  (09/04/2022)  Transportation Needs: No Transportation Needs (09/04/2022)  Utilities: Not At Risk (09/04/2022)  Tobacco Use: Low Risk  (09/04/2022)    Readmission Risk Interventions     No data to display

## 2022-09-06 NOTE — Progress Notes (Signed)
Progress Note   Patient: Brandi Poole GEX:[id number] DOB: 02-08-1946 DOA: 09/03/2022     2 DOS: the patient was seen and examined on 09/06/2022   Brief hospital course 77 y.o. female with medical history significant for hypertension, diabetes, A-fib on anticoagulation with warfarin presents with leg ulcer.  Patient has a history of chronic venous insufficiency.  Patient has a history of ulcers in the past.  Does not see anyone regularly for this.  Patient endorses worsening leg ulcer over the past 2 weeks which progressed to drainage from the wound associated with warmth and pain.  No evidence of trauma.  Good pedal pulses on affected extremity.   On my evaluation patient is resting comfortably in bed.  She is in no visible distress.  She answers all questions appropriately.  She does have a exam significant for erythema of left shin associated with a draining ulcer on the anterior aspect.  All vital signs stable.   5/7: Patient remained stable overnight. Blood cultures positive for gram-negative rods both aerobic and anaerobic bottles.    5/8 : Blood cultures positive for Pasteurella multocida  5/9 on IV Unasyn for Pasteurella multocida bacteremia    Assessment and Plan:  Pasteurella multi seeded bacteremia Purulent cellulitis of left lower extremity Severe sepsis secondary to above Sepsis criteria met with tachycardia pulse rate 100 and leukocytosis 12.6 and elevated lactic acid to 2.0.   Blood cultures yielded Pasteurella multocida Continue IV Unasyn Appreciate ID input, recommend repeat blood cultures.  Per ID physician patient will require minimum of 2 weeks of antibiotic therapy     Paroxysmal atrial fibrillation Chronic anticoagulation Continue home diltiazem and metoprolol for rate control Patient is on warfarin with a therapeutic INR   Essential hypertension Blood pressure is stable on Cardizem and metoprolol   Hyperlipidemia Continue Lipitor 10 mg daily    Obesity Criteria met with BMI 38 with 1 or more chronic comorbidities.  This complicates overall care and prognosis     Mild Cognitive impairment Patient is oriented to person and place but not to time Son confirms that he thinks she may have dementia but he has not been officially diagnosed Will require assistance with some ADLs.            Subjective: Patient is seen and examined at the bedside.  No new complaints  Physical Exam: Vitals:   09/05/22 1927 09/06/22 0425 09/06/22 0851 09/06/22 0858  BP: (!) 163/80 132/73 (!) 141/72 (!) 141/72  Pulse: 78 61 89 76  Resp: 18 20  20   Temp: 98 F (36.7 C) 97.8 F (36.6 C)  98 F (36.7 C)  TempSrc: Oral   Oral  SpO2: 98% 96%  98%  Weight:      Height:       Physical Exam Vitals and nursing note reviewed.  Constitutional:      Appearance: She is obese.  HENT:     Head: Normocephalic and atraumatic.     Nose: Nose normal.     Mouth/Throat:     Mouth: Mucous membranes are moist.  Eyes:     Conjunctiva/sclera: Conjunctivae normal.  Cardiovascular:     Rate and Rhythm: Normal rate and regular rhythm.  Pulmonary:     Effort: Pulmonary effort is normal.     Breath sounds: Normal breath sounds.  Abdominal:     General: Bowel sounds are normal.     Palpations: Abdomen is soft.     Comments: Central adiposity  Musculoskeletal:  General: Normal range of motion.     Cervical back: Normal range of motion and neck supple.     Right lower leg: Edema present.     Left lower leg: Edema present.  Skin:    Comments: Deroofed blister over left lower extremity  Neurological:     General: No focal deficit present.     Mental Status: She is alert.  Psychiatric:        Mood and Affect: Mood normal.        Behavior: Behavior normal.   Data Reviewed: Relevant notes from primary care and specialist visits, past discharge summaries as available in EHR, including Care Everywhere. Prior diagnostic testing as pertinent to current  admission diagnoses Updated medications and problem lists for reconciliation ED course, including vitals, labs, imaging, treatment and response to treatment Triage notes, nursing and pharmacy notes and ED provider's notes Notable results as noted in HPI  There are no new results to review at this time.  Family Communication: None  Disposition: Status is: Inpatient Remains inpatient appropriate because: On IV antibiotic therapy for Pasteurella multocida bacteremia  Planned Discharge Destination: Home with Home Health    Time spent: 33 minutes  Author: Lucile Shutters, MD 09/06/2022 12:39 PM  For on call review [email or web address].

## 2022-09-06 NOTE — Consult Note (Signed)
ANTICOAGULATION CONSULT NOTE  Pharmacy Consult for warfarin dosing Indication: atrial fibrillation  No Known Allergies  Patient Measurements: Height: 5\' 3"  (160 cm) Weight: 98.9 kg (218 lb) IBW/kg (Calculated) : 52.4   Vital Signs: Temp: 98 F (36.7 C) (05/09 0858) Temp Source: Oral (05/09 0858) BP: 141/72 (05/09 0858) Pulse Rate: 76 (05/09 0858)  Labs: Recent Labs    09/03/22 1229 09/03/22 1550 09/04/22 0256 09/05/22 0927 09/06/22 0733  HGB 14.1  --  12.1 12.9  --   HCT 41.8  --  36.6 38.9  --   PLT 208  --  162 170  --   LABPROT  --    < > 24.8* 26.0* 25.8*  INR  --    < > 2.3* 2.4* 2.3*  CREATININE 0.80  --  0.71 0.79  --    < > = values in this interval not displayed.     Estimated Creatinine Clearance: 68.1 mL/min (by C-G formula based on SCr of 0.79 mg/dL).   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  Reported last dose was 09/02/22 @ 1600.  Assessment: 77 yo female presented to the ED with fever, left leg pain and drainage from lower extremity ulcer.  Admitted for treatment of cellulitis with IV antibiotics. Patient has history of Afib currently on Warfarin.  Pharmacy has been consulted to manage warfarin dosing.  Interacting medication: Amiodarone  Goal of Therapy:  INR 2-3 Monitor platelets by anticoagulation protocol: Yes   Date INR Warfarin Dose  05/06 2.0 4 mg  05/07 2.3 2mg   05/08 2.4 2 mg  05/09 2.3 2 mg     Plan:  INR remains therapeutic at 2.3 Will give warfarin 2 mg po x 1 tonight Daily INR while inpatient CBC at least every 72  hours.   Elliot Gurney, PharmD, BCPS Clinical Pharmacist  09/06/2022 9:51 AM

## 2022-09-07 ENCOUNTER — Other Ambulatory Visit (HOSPITAL_COMMUNITY): Payer: Self-pay

## 2022-09-07 ENCOUNTER — Other Ambulatory Visit: Payer: Self-pay | Admitting: Infectious Diseases

## 2022-09-07 ENCOUNTER — Other Ambulatory Visit: Payer: Self-pay

## 2022-09-07 ENCOUNTER — Telehealth (HOSPITAL_COMMUNITY): Payer: Self-pay | Admitting: Pharmacy Technician

## 2022-09-07 DIAGNOSIS — I4811 Longstanding persistent atrial fibrillation: Secondary | ICD-10-CM | POA: Diagnosis not present

## 2022-09-07 DIAGNOSIS — R7881 Bacteremia: Secondary | ICD-10-CM | POA: Diagnosis not present

## 2022-09-07 DIAGNOSIS — L03116 Cellulitis of left lower limb: Secondary | ICD-10-CM | POA: Diagnosis not present

## 2022-09-07 DIAGNOSIS — A280 Pasteurellosis: Secondary | ICD-10-CM | POA: Diagnosis not present

## 2022-09-07 LAB — PROTIME-INR
INR: 2.2 — ABNORMAL HIGH (ref 0.8–1.2)
Prothrombin Time: 24.2 seconds — ABNORMAL HIGH (ref 11.4–15.2)

## 2022-09-07 LAB — AEROBIC CULTURE W GRAM STAIN (SUPERFICIAL SPECIMEN): Gram Stain: NONE SEEN

## 2022-09-07 MED ORDER — WARFARIN SODIUM 4 MG PO TABS
4.0000 mg | ORAL_TABLET | Freq: Once | ORAL | Status: DC
  Filled 2022-09-07: qty 1

## 2022-09-07 MED ORDER — POTASSIUM CHLORIDE CRYS ER 20 MEQ PO TBCR: 40.0000 meq | EXTENDED_RELEASE_TABLET | Freq: Every day | ORAL | 0 refills | Status: AC

## 2022-09-07 MED ORDER — FUROSEMIDE 20 MG PO TABS: 20.0000 mg | ORAL_TABLET | Freq: Every day | ORAL | 1 refills | Status: AC

## 2022-09-07 MED ORDER — AMOXICILLIN-POT CLAVULANATE ER 1000-62.5 MG PO TB12
2.0000 | ORAL_TABLET | Freq: Two times a day (BID) | ORAL | 0 refills | Status: AC
  Filled 2022-09-07: qty 40, 10d supply, fill #0

## 2022-09-07 NOTE — Discharge Summary (Signed)
Physician Discharge Summary  Brandi Poole QMV:[id number] DOB: 11/30/1945 DOA: 09/03/2022  PCP: Marguarite Arbour, MD  Admit date: 09/03/2022 Discharge date: 09/07/2022  Admitted From: Home Disposition:  Home  Discharge Condition:Stable CODE STATUS:FULL, Diet recommendation: Heart Healthy   Brief/Interim Summary:  Patient is a 77 year old female with medical history significant for hypertension, diabetes, A-fib on anticoagulation with warfarin presented with left  leg ulcer.  Patient has a history of chronic venous insufficiency.  Patient endorses worsening leg ulcer over the past 2 weeks which progressed to drainage from the wound associated with warmth and pain. No evidence of trauma. Good pedal pulses on affected extremity.  Blood cultures showed possible multocida.  ID consulted.  Initially started on IV antibiotic with Unasyn, has been changed to Augmentin .  ID cleared for discharge.  She is medically stable for discharge to home today..  Following problems were addressed during the hospitalization: Pasteurella multi seeded bacteremia Purulent cellulitis of left lower extremity Severe sepsis secondary to above Sepsis criteria met with tachycardia pulse rate 100 and leukocytosis 12.6 and elevated lactic acid to 2.0.   Blood cultures yielded Pasteurella multocida Repeat blood cultures have been negative.  Echo did not show any vegetation.  ID recommended Augmentin on discharge.  Sepsis physiology has resolved  Paroxysmal atrial fibrillation Chronic anticoagulation Continue home diltiazem and metoprolol for rate control Patient is on warfarin with a therapeutic INR   Essential hypertension Blood pressure is stable on Cardizem and metoprolol   Hyperlipidemia Continue Lipitor 10 mg daily   Obesity Criteria met with BMI 38 with 1 or more chronic comorbidities.  This complicates overall care and prognosis     Mild Cognitive impairment Patient is oriented to person and place but  not to time Son confirms that he thinks she may have dementia and it has going on for years.  Will require assistance with some ADLs. PT recommended home health on discharge.  Discharge Diagnoses:  Principal Problem:   Cellulitis of lower extremity Active Problems:   Essential hypertension   Atrial fibrillation (HCC)   Hyperlipidemia   Venous ulcer of ankle, left (HCC)   Chronic venous insufficiency   Severe sepsis with lactic acidosis (HCC)   Pasteurella infection   Gram-negative bacteremia   Wound of left leg    Discharge Instructions  Discharge Instructions     Diet - low sodium heart healthy   Complete by: As directed    Discharge instructions   Complete by: As directed    1)Please take prescribed medications as instructed 2)Follow up with your PCP in a week. 3)Monitor your blood pressure at home.   Increase activity slowly   Complete by: As directed    No wound care   Complete by: As directed       Allergies as of 09/07/2022   No Known Allergies      Medication List     TAKE these medications    amoxicillin-clavulanate 1000-62.5 MG 12 hr tablet Commonly known as: AUGMENTIN XR Take 2 tablets by mouth 2 (two) times daily for 10 days.   atorvastatin 10 MG tablet Commonly known as: LIPITOR Take 10 mg by mouth daily.   famotidine 20 MG tablet Commonly known as: PEPCID Take 20 mg by mouth every morning.   furosemide 20 MG tablet Commonly known as: Lasix Take 1 tablet (20 mg total) by mouth daily.   hydrocortisone cream 0.5 % Apply 1 application topically 2 (two) times daily. To bilateral lower extremities  losartan 25 MG tablet Commonly known as: COZAAR Take 25 mg by mouth daily.   metoprolol tartrate 25 MG tablet Commonly known as: LOPRESSOR Take 25 mg by mouth 2 (two) times daily.   potassium chloride SA 20 MEQ tablet Commonly known as: KLOR-CON M Take 2 tablets (40 mEq total) by mouth daily for 3 days.   Tiadylt ER 240 MG 24 hr  capsule Generic drug: diltiazem Take 240 mg by mouth daily.   warfarin 2 MG tablet Commonly known as: COUMADIN Take 2 mg by mouth daily. 2 mg Tuesday, Thursday and Saturday. 4 mg Sunday, Monday, Wednesday and Friday   warfarin 4 MG tablet Commonly known as: COUMADIN Take 4 mg by mouth daily. 4 mg Sunday, Monday, Wednesday and Friday. 2.5 mg Tuesday, Thursday and Saturday               Durable Medical Equipment  (From admission, onward)           Start     Ordered   09/07/22 0858  For home use only DME Walker rolling  Once       Question Answer Comment  Walker: With 5 Inch Wheels   Patient needs a walker to treat with the following condition Balance disorder      09/07/22 0858   09/07/22 0857  For home use only DME Walker rolling  Once       Question Answer Comment  Walker: With 5 Inch Wheels   Patient needs a walker to treat with the following condition Weakness generalized      09/07/22 0856            Follow-up Information     Sparks, Jeffrey D, MD. Schedule an appointment as soon as possible for a visit in 1 week(s).   Specialty: Internal Medicine Contact information: [address] [facility] [place] [address] [phone]                No Known Allergies  Consultations: ID   Procedures/Studies: ECHOCARDIOGRAM COMPLETE  Result Date: 09/05/2022    ECHOCARDIOGRAM REPORT   Patient Name:   Brandi Poole Date of Exam: 09/05/2022 Medical Rec #:  [id number]         Height:       63.0 in Accession #:    [id number]        Weight:       218.0 lb Date of Birth:  09/11/1945         BSA:          2.006 m Patient Age:    76 years          BP:           127/79 mmHg Patient Gender: F                 HR:           81  bpm. Exam Location:  [facility] Procedure: 2D Echo, Cardiac Doppler and Color Doppler Indications:     Bacteremia R78.81  History:         Patient has no prior history of Echocardiogram examinations.                   Arrythmias:Atrial Fibrillation; Risk Factors:Diabetes,                  Hypertension and Dyslipidemia.  Sonographer:     Cristela Blue Referring Phys:  [id number] Lynn Ito Diagnosing Phys: Debbe Odea  MD  Sonographer Comments: Suboptimal apical window. Apicals are Off axis. IMPRESSIONS  1. Left ventricular ejection fraction, by estimation, is 55 to 60%. The left ventricle has normal function. The left ventricle has no regional wall motion abnormalities. There is mild left ventricular hypertrophy. Left ventricular diastolic parameters are indeterminate.  2. Right ventricular systolic function is normal. The right ventricular size is normal.  3. Left atrial size was mildly dilated.  4. Right atrial size was mildly dilated.  5. The mitral valve is normal in structure. Mild mitral valve regurgitation.  6. The aortic valve is tricuspid. Aortic valve regurgitation is not visualized. FINDINGS  Left Ventricle: Left ventricular ejection fraction, by estimation, is 55 to 60%. The left ventricle has normal function. The left ventricle has no regional wall motion abnormalities. The left ventricular internal cavity size was normal in size. There is  mild left ventricular hypertrophy. Left ventricular diastolic parameters are indeterminate. Right Ventricle: The right ventricular size is normal. No increase in right ventricular wall thickness. Right ventricular systolic function is normal. Left Atrium: Left atrial size was mildly dilated. Right Atrium: Right atrial size was mildly dilated. Pericardium: There is no evidence of pericardial effusion. Mitral Valve: The mitral valve is normal in structure. Mild mitral valve regurgitation. MV peak gradient, 6.9 mmHg. The mean mitral valve gradient is 2.0 mmHg. Tricuspid Valve: The tricuspid valve is normal in structure. Tricuspid valve regurgitation is mild. Aortic Valve: The aortic valve is tricuspid. Aortic valve regurgitation is not visualized. Aortic valve mean gradient  measures 3.0 mmHg. Aortic valve peak gradient measures 4.7 mmHg. Pulmonic Valve: The pulmonic valve was not well visualized. Pulmonic valve regurgitation is not visualized. Aorta: The aortic root is normal in size and structure. IAS/Shunts: No atrial level shunt detected by color flow Doppler.   Diastology LV e' medial:    15.20 cm/s LV E/e' medial:  5.6 LV e' lateral:   10.30 cm/s LV E/e' lateral: 8.3  RIGHT VENTRICLE RV Basal diam:  3.40 cm RV Mid diam:    3.40 cm LEFT ATRIUM           Index        RIGHT ATRIUM           Index LA Vol (A4C): 26.2 ml 13.06 ml/m  RA Area:     21.70 cm                                    RA Volume:   60.30 ml  30.06 ml/m  AORTIC VALVE AV Vmax:           108.50 cm/s AV Vmean:          72.850 cm/s AV VTI:            0.211 m AV Peak Grad:      4.7 mmHg AV Mean Grad:      3.0 mmHg LVOT Vmax:         83.80 cm/s LVOT Vmean:        51.200 cm/s LVOT VTI:          0.158 m LVOT/AV VTI ratio: 0.75 MITRAL VALVE               TRICUSPID VALVE MV Area (PHT): 3.31 cm    TR Peak grad:   23.2 mmHg MV Peak grad:  6.9 mmHg    TR Vmax:  241.00 cm/s MV Mean grad:  2.0 mmHg MV Vmax:       1.31 m/s    SHUNTS MV Vmean:      54.1 cm/s   Systemic VTI: 0.16 m MV Decel Time: 229 msec MV E velocity: 85.40 cm/s Debbe Odea MD Electronically signed by Debbe Odea MD Signature Date/Time: 09/05/2022/4:30:39 PM    Final    CT HEAD WO CONTRAST ( )  Result Date: 09/04/2022 CLINICAL DATA:  Altered mental status EXAM: CT HEAD WITHOUT CONTRAST TECHNIQUE: Contiguous axial images were obtained from the base of the skull through the vertex without intravenous contrast. RADIATION DOSE REDUCTION: This exam was performed according to the departmental dose-optimization program which includes automated exposure control, adjustment of the mA and/or kV according to patient size and/or use of iterative reconstruction technique. COMPARISON:  None Available. FINDINGS: Brain: There is no mass, hemorrhage or  extra-axial collection. The size and configuration of the ventricles and extra-axial CSF spaces are normal. There is hypoattenuation of the white matter, most commonly indicating chronic small vessel disease. Vascular: No abnormal hyperdensity of the major intracranial arteries or dural venous sinuses. No intracranial atherosclerosis. Skull: The visualized skull base, calvarium and extracranial soft tissues are normal. Sinuses/Orbits: No fluid levels or advanced mucosal thickening of the visualized paranasal sinuses. No mastoid or middle ear effusion. The orbits are normal. IMPRESSION: Chronic small vessel disease without acute intracranial abnormality. Electronically Signed   By: Deatra Robinson M.D.   On: 09/04/2022 20:01   DG Chest 2 View  Result Date: 09/03/2022 CLINICAL DATA:  History of leg cellulitis with fevers and chills EXAM: CHEST - 2 VIEW COMPARISON:  X-ray 01/17/2017 FINDINGS: Calcified aorta. Normal cardiopericardial silhouette. No pneumothorax, effusion or consolidation. Interstitial changes noted bilaterally which are increased from the previous examination. Acute versus chronic. Recommend short follow-up. Degenerative changes of the spine IMPRESSION: New diffuse scattered interstitial prominence. Acute versus chronic. Please correlate with symptoms and short follow-up is recommended. No effusion or consolidation Electronically Signed   By: Karen Kays M.D.   On: 09/03/2022 13:15   MM 3D SCREENING MAMMOGRAM BILATERAL BREAST  Result Date: 08/28/2022 CLINICAL DATA:  Screening. EXAM: DIGITAL SCREENING BILATERAL MAMMOGRAM WITH TOMOSYNTHESIS AND CAD TECHNIQUE: Bilateral screening digital craniocaudal and mediolateral oblique mammograms were obtained. Bilateral screening digital breast tomosynthesis was performed. The images were evaluated with computer-aided detection. COMPARISON:  Previous exam(s). ACR Breast Density Category c: The breasts are heterogeneously dense, which may obscure small masses.  FINDINGS: There are no findings suspicious for malignancy. IMPRESSION: No mammographic evidence of malignancy. A result letter of this screening mammogram will be mailed directly to the patient. RECOMMENDATION: Screening mammogram in one year. (Code:SM-B-01Y) BI-RADS CATEGORY  1: Negative. Electronically Signed   By: Elberta Fortis M.D.   On: 08/28/2022 16:42      Subjective: Patient seen and examined the bedside today.  Hemodynamically stable.  Sitting in the chair.  Alert, awake, obeys commands but confused to time.  Has bilateral chronic lower extremity edema.  Medically stable for discharge.  I called and discussed with son about the discharge planning.  Discharge Exam: Vitals:   09/07/22 0411 09/07/22 0809  BP: (!) 142/79 (!) 150/81  Pulse: 91 68  Resp: 18 18  Temp: 98 F (36.7 C) 97.8 F (36.6 C)  SpO2: 95% 93%   Vitals:   09/06/22 0858 09/06/22 1949 09/07/22 0411 09/07/22 0809  BP: (!) 141/72 (!) 155/68 (!) 142/79 (!) 150/81  Pulse: 76 79 91 68  Resp:  20 20 18 18   Temp: 98 F (36.7 C) 98.2 F (36.8 C) 98 F (36.7 C) 97.8 F (36.6 C)  TempSrc: Oral  Oral Oral  SpO2: 98% 95% 95% 93%  Weight:      Height:        General: Pt is alert, awake, not in acute distress, obese Cardiovascular: Irregularly irregular rhythm, no rubs, no gallops Respiratory: CTA bilaterally, no wheezing, no rhonchi Abdominal: Soft, NT, ND, bowel sounds + Extremities: Bilateral chronic lower extremity edema, shallow ulcer on the right lower extremity    The results of significant diagnostics from this hospitalization (including imaging, microbiology, ancillary and laboratory) are listed below for reference.     Microbiology: Recent Results (from the past 240 hour(s))  Blood culture (routine x 2)     Status: Abnormal   Collection Time: 09/03/22  4:00 PM   Specimen: BLOOD  Result Value Ref Range Status   Specimen Description   Final    BLOOD BLOOD RIGHT FOREARM Performed at [facility], [address] [place], [place] [address]    Special Requests   Final    BOTTLES DRAWN AEROBIC AND ANAEROBIC Blood Culture adequate volume Performed at [facility], [address], [place], [place] [address]    Culture  Setup Time   Final    GRAM NEGATIVE RODS IN BOTH AEROBIC AND ANAEROBIC BOTTLES CRITICAL VALUE NOTED.  VALUE IS CONSISTENT WITH PREVIOUSLY REPORTED AND CALLED VALUE. Performed at [facility], [address] [place], [place] [address]    Culture (A)  Final    PASTEURELLA MULTOCIDA Usually susceptible to penicillin and other beta lactam agents,quinolones,macrolides and tetracyclines. Performed at [facility], [address] [place], [place] [address]    Report Status 09/06/2022 FINAL  Final  Blood culture (routine x 2)     Status: Abnormal   Collection Time: 09/03/22  4:00 PM   Specimen: BLOOD  Result Value Ref Range Status   Specimen Description   Final    BLOOD RIGHT ANTECUBITAL Performed at [facility], [address], [place], [place] [address]    Special Requests   Final    BOTTLES DRAWN AEROBIC AND ANAEROBIC Blood Culture adequate volume Performed at [facility], [address] [place], [place] [address]    Culture  Setup Time   Final    GRAM NEGATIVE RODS IN BOTH AEROBIC AND ANAEROBIC BOTTLES Organism ID to follow CRITICAL RESULT CALLED TO, READ BACK BY AND VERIFIED WITH: JASON [email or web address]  09/04/22 RH Performed at [facility], [address] [place], [place] [address]    Culture (A)  Final    PASTEURELLA MULTOCIDA Usually susceptible to penicillin and other beta lactam agents,quinolones,macrolides and tetracyclines. Performed at [facility], [address], [place], [place] [address]    Report Status 09/06/2022 FINAL  Final  Blood Culture ID Panel (Reflexed)     Status: None   Collection Time: 09/03/22  4:00 PM  Result Value Ref Range Status   Enterococcus faecalis NOT  DETECTED NOT DETECTED Final   Enterococcus Faecium NOT DETECTED NOT DETECTED Final   Listeria monocytogenes NOT DETECTED NOT DETECTED Final   Staphylococcus species NOT DETECTED NOT DETECTED Final   Staphylococcus aureus (BCID) NOT DETECTED NOT DETECTED Final   Staphylococcus epidermidis NOT DETECTED NOT DETECTED Final   Staphylococcus lugdunensis NOT DETECTED NOT DETECTED Final   Streptococcus species NOT DETECTED NOT DETECTED Final   Streptococcus agalactiae NOT DETECTED NOT  DETECTED Final   Streptococcus pneumoniae NOT DETECTED NOT DETECTED Final   Streptococcus pyogenes NOT DETECTED NOT DETECTED Final   A.calcoaceticus-baumannii NOT DETECTED NOT DETECTED Final   Bacteroides fragilis NOT DETECTED NOT DETECTED Final   Enterobacterales NOT DETECTED NOT DETECTED Final   Enterobacter cloacae complex NOT DETECTED NOT DETECTED Final   Escherichia coli NOT DETECTED NOT DETECTED Final   Klebsiella aerogenes NOT DETECTED NOT DETECTED Final   Klebsiella oxytoca NOT DETECTED NOT DETECTED Final   Klebsiella pneumoniae NOT DETECTED NOT DETECTED Final   Proteus species NOT DETECTED NOT DETECTED Final   Salmonella species NOT DETECTED NOT DETECTED Final   Serratia marcescens NOT DETECTED NOT DETECTED Final   Haemophilus influenzae NOT DETECTED NOT DETECTED Final   Neisseria meningitidis NOT DETECTED NOT DETECTED Final   Pseudomonas aeruginosa NOT DETECTED NOT DETECTED Final   Stenotrophomonas maltophilia NOT DETECTED NOT DETECTED Final   Candida albicans NOT DETECTED NOT DETECTED Final   Candida auris NOT DETECTED NOT DETECTED Final   Candida glabrata NOT DETECTED NOT DETECTED Final   Candida krusei NOT DETECTED NOT DETECTED Final   Candida parapsilosis NOT DETECTED NOT DETECTED Final   Candida tropicalis NOT DETECTED NOT DETECTED Final   Cryptococcus neoformans/gattii NOT DETECTED NOT DETECTED Final    Comment: Performed at [facility], [address], [place], [place] [address]   Aerobic Culture w Gram Stain (superficial specimen)     Status: Abnormal   Collection Time: 09/04/22  2:15 PM   Specimen: Wound  Result Value Ref Range Status   Specimen Description   Final    WOUND Performed at [facility], [address] [place], [place] [address]    Special Requests   Final    WOUND Performed at [facility], [address] [place], [place] [address]    Gram Stain NO WBC SEEN NO ORGANISMS SEEN   Final   Culture (A)  Final    MULTIPLE ORGANISMS PRESENT, NONE PREDOMINANT NO STAPHYLOCOCCUS AUREUS ISOLATED NO GROUP A STREP (S.PYOGENES) ISOLATED NO PASTURELLA ISOLATED Performed at [facility], [address] [place], [place] [address]    Report Status 09/06/2022 FINAL  Final  Aerobic Culture w Gram Stain (superficial specimen)     Status: Abnormal   Collection Time: 09/04/22  7:20 PM   Specimen: Wound  Result Value Ref Range Status   Specimen Description   Final    WOUND Performed at [facility], [address] [place], [place] [address]    Special Requests   Final    LEFT LEG Performed at [facility], [address] [place], [place] [address]    Gram Stain NO WBC SEEN NO ORGANISMS SEEN   Final   Culture (A)  Final    MULTIPLE ORGANISMS PRESENT, NONE PREDOMINANT NO STAPHYLOCOCCUS AUREUS ISOLATED NO GROUP A STREP (S.PYOGENES) ISOLATED NO PASTURELLA ISOLATED Performed at [facility], [address] [place], [place] [address]    Report Status 09/06/2022 FINAL  Final  Culture, blood (Routine X 2) w Reflex to ID Panel     Status: None (Preliminary result)   Collection Time: 09/05/22  9:27 AM   Specimen: BLOOD  Result Value Ref Range Status   Specimen Description BLOOD BLOOD RIGHT ARM  Final   Special Requests   Final    BOTTLES DRAWN AEROBIC AND ANAEROBIC Blood Culture adequate volume   Culture   Final    NO GROWTH 2 DAYS Performed at  [facility], [address],  [place], [place] [address]    Report Status PENDING  Incomplete  Culture, blood (Routine X 2) w Reflex to ID Panel     Status: None (Preliminary result)   Collection Time: 09/05/22  9:39 AM   Specimen: BLOOD  Result Value Ref Range Status   Specimen Description BLOOD BLOOD LEFT HAND  Final   Special Requests   Final    BOTTLES DRAWN AEROBIC ONLY Blood Culture adequate volume   Culture   Final    NO GROWTH 2 DAYS Performed at [facility], [address], [place], [place] [address]    Report Status PENDING  Incomplete     Labs: BNP (last 3 results) No results for input(s): "BNP" in the last 8760 hours. Basic Metabolic Panel: Recent Labs  Lab 09/03/22 1229 09/04/22 0256 09/05/22 0927  NA 137 136 138  K 3.8 3.5 3.5  CL 104 107 108  CO2 21* 21* 22  GLUCOSE 147* 113* 172*  BUN 15 14 11   CREATININE 0.80 0.71 0.79  CALCIUM 8.8* 7.8* 8.5*   Liver Function Tests: Recent Labs  Lab 09/03/22 1229  AST 37  ALT 19  ALKPHOS 73  BILITOT 2.0*  PROT 7.0  ALBUMIN 3.6   No results for input(s): "LIPASE", "AMYLASE" in the last 168 hours. No results for input(s): "AMMONIA" in the last 168 hours. CBC: Recent Labs  Lab 09/03/22 1229 09/04/22 0256 09/05/22 0927  WBC 12.6* 14.0* 8.5  NEUTROABS 11.4*  --  6.0  HGB 14.1 12.1 12.9  HCT 41.8 36.6 38.9  MCV 88.9 90.4 88.6  PLT 208 162 170   Cardiac Enzymes: No results for input(s): "CKTOTAL", "CKMB", "CKMBINDEX", "TROPONINI" in the last 168 hours. BNP: Invalid input(s): "POCBNP" CBG: No results for input(s): "GLUCAP" in the last 168 hours. D-Dimer No results for input(s): "DDIMER" in the last 72 hours. Hgb A1c No results for input(s): "HGBA1C" in the last 72 hours. Lipid Profile No results for input(s): "CHOL", "HDL", "LDLCALC", "TRIG", "CHOLHDL", "LDLDIRECT" in the last 72 hours. Thyroid function studies No results for input(s): "TSH", "T4TOTAL", "T3FREE", "THYROIDAB" in the last 72 hours.  Invalid input(s):  "FREET3" Anemia work up No results for input(s): "VITAMINB12", "FOLATE", "FERRITIN", "TIBC", "IRON", "RETICCTPCT" in the last 72 hours. Urinalysis    Component Value Date/Time   COLORURINE AMBER (A) 09/03/2022 1229   APPEARANCEUR CLOUDY (A) 09/03/2022 1229   LABSPEC 1.029 09/03/2022 1229   PHURINE 5.0 09/03/2022 1229   GLUCOSEU NEGATIVE 09/03/2022 1229   HGBUR NEGATIVE 09/03/2022 1229   BILIRUBINUR NEGATIVE 09/03/2022 1229   KETONESUR 5 (A) 09/03/2022 1229   PROTEINUR 30 (A) 09/03/2022 1229   NITRITE NEGATIVE 09/03/2022 1229   LEUKOCYTESUR TRACE (A) 09/03/2022 1229   Sepsis Labs Recent Labs  Lab 09/03/22 1229 09/04/22 0256 09/05/22 0927  WBC 12.6* 14.0* 8.5   Microbiology Recent Results (from the past 240 hour(s))  Blood culture (routine x 2)     Status: Abnormal   Collection Time: 09/03/22  4:00 PM   Specimen: BLOOD  Result Value Ref Range Status   Specimen Description   Final    BLOOD BLOOD RIGHT FOREARM Performed at [facility], [address] [place], [place] [address]    Special Requests   Final    BOTTLES DRAWN AEROBIC AND ANAEROBIC Blood Culture adequate volume Performed at [facility], [address] [place], [place] [address]    Culture  Setup Time   Final  GRAM NEGATIVE RODS IN BOTH AEROBIC AND ANAEROBIC BOTTLES CRITICAL VALUE NOTED.  VALUE IS CONSISTENT WITH PREVIOUSLY REPORTED AND CALLED VALUE. Performed at [facility], [address] [place], [place] [address]    Culture (A)  Final    PASTEURELLA MULTOCIDA Usually susceptible to penicillin and other beta lactam agents,quinolones,macrolides and tetracyclines. Performed at [facility], [address], [place], [place] [address]    Report Status 09/06/2022 FINAL  Final  Blood culture (routine x 2)     Status: Abnormal   Collection Time: 09/03/22  4:00 PM   Specimen: BLOOD  Result Value Ref Range Status   Specimen Description   Final    BLOOD RIGHT  ANTECUBITAL Performed at [facility], [address], [place], [place] [address]    Special Requests   Final    BOTTLES DRAWN AEROBIC AND ANAEROBIC Blood Culture adequate volume Performed at [facility], [address], [place], [place] [address]    Culture  Setup Time   Final    GRAM NEGATIVE RODS IN BOTH AEROBIC AND ANAEROBIC BOTTLES Organism ID to follow CRITICAL RESULT CALLED TO, READ BACK BY AND VERIFIED WITH: JASON [email or web address]  09/04/22 RH Performed at [facility], [address] [place], [place] [address]    Culture (A)  Final    PASTEURELLA MULTOCIDA Usually susceptible to penicillin and other beta lactam agents,quinolones,macrolides and tetracyclines. Performed at [facility], [address] [address] [place], [place] [address]    Report Status 09/06/2022 FINAL  Final  Blood Culture ID Panel (Reflexed)     Status: None   Collection Time: 09/03/22  4:00 PM  Result Value Ref Range Status   Enterococcus faecalis NOT DETECTED NOT DETECTED Final   Enterococcus Faecium NOT DETECTED NOT DETECTED Final   Listeria monocytogenes NOT DETECTED NOT DETECTED Final   Staphylococcus species NOT DETECTED NOT DETECTED Final   Staphylococcus aureus (BCID) NOT DETECTED NOT DETECTED Final   Staphylococcus epidermidis NOT DETECTED NOT DETECTED Final   Staphylococcus lugdunensis NOT DETECTED NOT DETECTED Final   Streptococcus species NOT DETECTED NOT DETECTED Final   Streptococcus agalactiae NOT DETECTED NOT DETECTED Final   Streptococcus pneumoniae NOT DETECTED NOT DETECTED Final   Streptococcus pyogenes NOT DETECTED NOT DETECTED Final   A.calcoaceticus-baumannii NOT DETECTED NOT DETECTED Final   Bacteroides fragilis NOT DETECTED NOT DETECTED Final   Enterobacterales NOT DETECTED NOT DETECTED Final   Enterobacter cloacae complex NOT DETECTED NOT DETECTED Final   Escherichia coli NOT DETECTED NOT DETECTED Final   Klebsiella aerogenes NOT DETECTED NOT DETECTED  Final   Klebsiella oxytoca NOT DETECTED NOT DETECTED Final   Klebsiella pneumoniae NOT DETECTED NOT DETECTED Final   Proteus species NOT DETECTED NOT DETECTED Final   Salmonella species NOT DETECTED NOT DETECTED Final   Serratia marcescens NOT DETECTED NOT DETECTED Final   Haemophilus influenzae NOT DETECTED NOT DETECTED Final   Neisseria meningitidis NOT DETECTED NOT DETECTED Final   Pseudomonas aeruginosa NOT DETECTED NOT DETECTED Final   Stenotrophomonas maltophilia NOT DETECTED NOT DETECTED Final   Candida albicans NOT DETECTED NOT DETECTED Final   Candida auris NOT DETECTED NOT DETECTED Final   Candida glabrata NOT DETECTED NOT DETECTED Final   Candida krusei NOT DETECTED NOT DETECTED Final   Candida parapsilosis NOT DETECTED NOT DETECTED Final   Candida tropicalis NOT DETECTED NOT DETECTED Final   Cryptococcus neoformans/gattii NOT DETECTED NOT DETECTED Final    Comment: Performed at [facility], [address] [place], [place] [address]  Aerobic Culture w Gram Stain (superficial specimen)     Status: Abnormal   Collection Time: 09/04/22  2:15 PM   Specimen: Wound  Result Value Ref Range Status   Specimen Description   Final    WOUND Performed at [facility], [address] [place], [place] [address]    Special Requests   Final    WOUND Performed at [facility], [address] [place], [place] [address]    Gram Stain NO WBC SEEN NO ORGANISMS SEEN   Final   Culture (A)  Final    MULTIPLE ORGANISMS PRESENT, NONE PREDOMINANT NO STAPHYLOCOCCUS AUREUS ISOLATED NO GROUP A STREP (S.PYOGENES) ISOLATED NO PASTURELLA ISOLATED Performed at [facility], [address] [place], [place] [address]    Report Status 09/06/2022 FINAL  Final  Aerobic Culture w Gram Stain (superficial specimen)     Status: Abnormal   Collection Time: 09/04/22  7:20 PM   Specimen: Wound  Result Value Ref Range Status   Specimen Description   Final     WOUND Performed at [facility], [address] [place], [place] [address]    Special Requests   Final    LEFT LEG Performed at [facility], [address] [place], [place] [address]    Gram Stain NO WBC SEEN NO ORGANISMS SEEN   Final   Culture (A)  Final    MULTIPLE ORGANISMS PRESENT, NONE PREDOMINANT NO STAPHYLOCOCCUS AUREUS ISOLATED NO GROUP A STREP (S.PYOGENES) ISOLATED NO PASTURELLA ISOLATED Performed at [facility], [address] [place], [place] [address]    Report Status 09/06/2022 FINAL  Final  Culture, blood (Routine X 2) w Reflex to ID Panel     Status: None (Preliminary result)   Collection Time: 09/05/22  9:27 AM   Specimen: BLOOD  Result Value Ref Range Status   Specimen Description BLOOD BLOOD RIGHT ARM  Final   Special Requests   Final    BOTTLES DRAWN AEROBIC AND ANAEROBIC Blood Culture adequate volume   Culture   Final    NO GROWTH 2 DAYS Performed at [facility], [address] [place], [place] [address]    Report Status PENDING  Incomplete  Culture, blood (Routine X 2) w Reflex to ID Panel     Status: None (Preliminary result)   Collection Time: 09/05/22  9:39 AM   Specimen: BLOOD  Result Value Ref Range Status   Specimen Description BLOOD BLOOD LEFT HAND  Final   Special Requests   Final    BOTTLES DRAWN AEROBIC ONLY Blood Culture adequate volume   Culture   Final    NO GROWTH 2 DAYS Performed at [facility], [address], [place], [place] [address]    Report Status PENDING  Incomplete    Please note: You were cared for by a hospitalist during your hospital stay. Once you are discharged, your primary care physician will handle any further medical issues. Please note that NO REFILLS for any discharge medications will be authorized once you are discharged, as it is imperative that you return to your primary care physician (or establish a relationship with a primary care physician if you do not  have one) for your post hospital discharge needs so that they can reassess your need for medications and monitor your lab values.    Time coordinating discharge: 40 minutes  SIGNED:   Burnadette Pop, MD  [facility] 09/07/2022,  12:03 PM Pager [phone]  If 7PM-7AM, please contact night-coverage [email or web address] Password [email or web address]

## 2022-09-07 NOTE — Progress Notes (Signed)
Date of Admission:  09/03/2022    ID: JAMAL ANTROBUS is a 77 y.o. female  Principal Problem:   Cellulitis of lower extremity Active Problems:   Essential hypertension   Atrial fibrillation (HCC)   Hyperlipidemia   Venous ulcer of ankle, left (HCC)   Chronic venous insufficiency   Severe sepsis with lactic acidosis (HCC)   Pasteurella infection   Gram-negative bacteremia   Wound of left leg   Subjective Doing well Waiting to go home   Medications:   atorvastatin  10 mg Oral Daily   diltiazem  240 mg Oral BH-q7a   famotidine  20 mg Oral BH-q7a   metoprolol tartrate  25 mg Oral BID   QUEtiapine  25 mg Oral QHS   warfarin  4 mg Oral ONCE-1600   Warfarin - Pharmacist Dosing Inpatient   Does not apply q1600    Objective: Vital signs in last 24 hours: Patient Vitals for the past 24 hrs:  BP Temp Temp src Pulse Resp SpO2  09/07/22 0809 (!) 150/81 97.8 F (36.6 C) Oral 68 18 93 %  09/07/22 0411 (!) 142/79 98 F (36.7 C) Oral 91 18 95 %  09/06/22 1949 (!) 155/68 98.2 F (36.8 C) -- 79 20 95 %      PHYSICAL EXAM:  General: Alert, cooperative, no distress, intermittently confused  Lungs: Clear to auscultation bilaterally. No Wheezing or Rhonchi. No rales. Heart: Regular rate and rhythm, no murmur, rub or gallop. Abdomen: Soft, non-tender,not distended. Bowel sounds normal. No masses Extremities: edema left leg >> rt Erythema left leg much improved Deroofed blister Skin: No rashes or lesions. Or bruising Lymph: Cervical, supraclavicular normal. Neurologic: Grossly non-focal  Lab Results    Latest Ref Rng & Units 09/05/2022    9:27 AM 09/04/2022    2:56 AM 09/03/2022   12:29 PM  CBC  WBC 4.0 - 10.5 K/uL 8.5  14.0  12.6   Hemoglobin 12.0 - 15.0 g/dL 40.9  81.1  91.4   Hematocrit 36.0 - 46.0 % 38.9  36.6  41.8   Platelets 150 - 400 K/uL 170  162  208        Latest Ref Rng & Units 09/05/2022    9:27 AM 09/04/2022    2:56 AM 09/03/2022   12:29 PM  CMP  Glucose 70 - 99  mg/dL 782  956  213   BUN 8 - 23 mg/dL 11  14  15    Creatinine 0.44 - 1.00 mg/dL 0.86  5.78  4.69   Sodium 135 - 145 mmol/L 138  136  137   Potassium 3.5 - 5.1 mmol/L 3.5  3.5  3.8   Chloride 98 - 111 mmol/L 108  107  104   CO2 22 - 32 mmol/L 22  21  21    Calcium 8.9 - 10.3 mg/dL 8.5  7.8  8.8   Total Protein 6.5 - 8.1 g/dL   7.0   Total Bilirubin 0.3 - 1.2 mg/dL   2.0   Alkaline Phos 38 - 126 U/L   73   AST 15 - 41 U/L   37   ALT 0 - 44 U/L   19       Microbiology: Bc 4/4 pasteurella Repeat BC neg Studies/Results: No results found.   Assessment/Plan: Pasteurella multocida bacteremia 2 d echo no veg Repeat blood culture-neg On unasyn - will change to Augmentin XR 2 grams PO BID for 10 more days  Chronic venous edema legs Venous  ulceration  Chronic afib  Early neurocognitive impairment? Discussed the management with the patient, her daughter and son Explained side effects of antibiotic, lab monitoring next week  and hydration

## 2022-09-07 NOTE — TOC Benefit Eligibility Note (Signed)
Patient Product/process development scientist completed.    The patient is currently admitted and upon discharge could be taking amoxicillin-clavulanate (Augmentin XR) 1000-62.5 mg 12 hr tablet.  The current 20 day co-pay is $0.00.   The patient is insured through [employer] Part D   This test claim was processed through [facility]- copay amounts may vary at other pharmacies due to pharmacy/plan contracts, or as the patient moves through the different stages of their insurance plan.  Roland Earl, CPHT Pharmacy Patient Advocate Specialist [facility] Patient Advocate Team Direct Number: [phone]  Fax: [phone]

## 2022-09-07 NOTE — Telephone Encounter (Signed)
Pharmacy Patient Advocate Encounter  Insurance verification completed.    The patient is insured through [employer] Part D   The patient is currently admitted and ran test claims for the following: amoxicillin-clavulanate (Augmentin XR).  Copays and coinsurance results were relayed to Inpatient clinical team.

## 2022-09-07 NOTE — TOC Initial Note (Addendum)
Transition of Care [facility]) - Initial/Assessment Note    Patient Details  Name: Brandi Poole MRN: [id number] Date of Birth: 1945/09/17  [facility]) CM/SW Contact:    Chapman Fitch, RN Phone Number: 09/07/2022, 1:23 PM  Clinical Narrative:                  Admitted for: sepsis Admitted from: from home with daughter Tresa Endo PCP: Judithann Sheen- family to transport to appointments.  Son to transport at discharge  Therapy recommending home health.  Patient in agreement and states that she does not have a preference of agency.  Referral accepted by [facility] with Frances Furbish.  Start of care 5/13 or 5/14.  Updated daughter Tresa Endo with patients permission  Referral for RW to [facility] with [employer] to be delivered to the room  Update. Patient discharged prior to RW being delivered.  [employer] notified so RW could be delivered to home         Patient Goals and CMS Choice            Expected Discharge Plan and Services         Expected Discharge Date: 09/07/22                                    Prior Living Arrangements/Services                       Activities of Daily Living Home Assistive Devices/Equipment: None ADL Screening (condition at time of admission) Patient's cognitive ability adequate to safely complete daily activities?: Yes Is the patient deaf or have difficulty hearing?: No Does the patient have difficulty seeing, even when wearing glasses/contacts?: No Does the patient have difficulty concentrating, remembering, or making decisions?: No Patient able to express need for assistance with ADLs?: Yes Does the patient have difficulty dressing or bathing?: No Independently performs ADLs?: Yes (appropriate for developmental age) Does the patient have difficulty walking or climbing stairs?: No Weakness of Legs: None Weakness of Arms/Hands: None  Permission Sought/Granted                  Emotional Assessment              Admission  diagnosis:  Cellulitis of lower extremity [L03.119] Wound of left lower extremity, initial encounter [S81.802A] Sepsis due to cellulitis (HCC) [L03.90, A41.9] Severe sepsis with lactic acidosis (HCC) [A41.9, R65.20, E87.20] Patient Active Problem List   Diagnosis Date Noted   Pasteurella infection 09/05/2022   Gram-negative bacteremia 09/05/2022   Wound of left leg 09/05/2022   Severe sepsis with lactic acidosis (HCC) 09/04/2022   Cellulitis of lower extremity 09/03/2022   Anxiety 04/12/2022   Depression 04/12/2022   Family history of diabetes mellitus 04/12/2022   Fibrocystic breast disease 04/12/2022   Obesity 04/12/2022   PMB (postmenopausal bleeding) 04/12/2022   Hypertension 04/12/2022   Venous ulcer of ankle, left (HCC) 03/26/2022   Chronic venous insufficiency 03/26/2022   Lymphedema 03/25/2022   Essential hypertension 03/25/2022   Atrial fibrillation (HCC) 03/25/2022   Hyperlipidemia 03/25/2022   Prediabetes 01/08/2022   Nuclear sclerotic cataract of both eyes 01/13/2021   Hyperglycemia 11/07/2018   Chronic a-fib (HCC) 05/19/2018   Bilateral leg edema 06/05/2017   Hx of obesity 01/11/2014   PCP:  Marguarite Arbour, MD Pharmacy:   [address] - [place], [place] - [address] [place] [address] Phone: [phone] Fax: [phone]  [facility] [address] [place] - [address] [place] [address] Phone: [phone] Fax: [phone]  [facility] [address] [place] [address] Phone: [phone] Fax: [phone]     Social Determinants of Health (SDOH) Social History: SDOH Screenings   Food Insecurity: No Food Insecurity (09/04/2022)  Housing: Low Risk  (09/04/2022)  Transportation Needs: No Transportation Needs (09/04/2022)  Utilities: Not At Risk (09/04/2022)  Tobacco Use: Low Risk  (09/04/2022)   SDOH Interventions:     Readmission Risk  Interventions     No data to display

## 2022-09-07 NOTE — Consult Note (Signed)
ANTICOAGULATION CONSULT NOTE  Pharmacy Consult for warfarin dosing Indication: atrial fibrillation  No Known Allergies  Patient Measurements: Height: 5\' 3"  (160 cm) Weight: 98.9 kg (218 lb) IBW/kg (Calculated) : 52.4   Vital Signs: Temp: 98 F (36.7 C) (05/10 0411) Temp Source: Oral (05/10 0411) BP: 142/79 (05/10 0411) Pulse Rate: 91 (05/10 0411)  Labs: Recent Labs    09/05/22 0927 09/06/22 0733 09/07/22 0411  HGB 12.9  --   --   HCT 38.9  --   --   PLT 170  --   --   LABPROT 26.0* 25.8* 24.2*  INR 2.4* 2.3* 2.2*  CREATININE 0.79  --   --      Estimated Creatinine Clearance: 68.1 mL/min (by C-G formula based on SCr of 0.79 mg/dL).   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  Reported last dose was 09/02/22 @ 1600.  Assessment: 77 yo female presented to the ED with fever, left leg pain and drainage from lower extremity ulcer.  Admitted for treatment of cellulitis with IV antibiotics. Patient has history of Afib currently on Warfarin.  Pharmacy has been consulted to manage warfarin dosing.  Interacting medication: Amiodarone  Goal of Therapy:  INR 2-3 Monitor platelets by anticoagulation protocol: Yes   Date INR Warfarin Dose  05/06 2.0 4 mg  05/07 2.3 2mg   05/08 2.4 2 mg  05/09 2.3 2 mg  05/10 2.2 4 mg     Plan: INR remains therapeutic at 2.2 Will give warfarin 4 mg PO x 1 tonight Daily INR while inpatient CBC at least every 72 hours.   Elliot Gurney, PharmD, BCPS Clinical Pharmacist  09/07/2022 7:39 AM

## 2022-09-07 NOTE — Evaluation (Signed)
Physical Therapy Evaluation Patient Details Name: Brandi Poole MRN: [id number] DOB: Oct 23, 1945 Today's Date: 09/07/2022  History of Present Illness  Pt is a 77 yo female that presented to ED for fever, LLE pain and drainage. Workup for cellulitis. PMH of afib, anxiety, DM, HTN.   Clinical Impression  Patient alert, agreeable to PT, oriented to self, "hospital" month/year. Pt stated at baseline she is ambulatory without AD, unclear about which family she lives with currently. Stated she does not need assistance for ADLs, family assists with errands/driving.   The patient was able to transfer with and without RW, but improved safety and mobility with RW noted. She ambulated ~231ft with supervision and RW. IV noted to be bleeding, RN notified. Pt able to describe calling 911 if needed for emergencies at home, as well as a cognitive task from start to finish.  Overall the patient demonstrated deficits (see "PT Problem List") that impede the patient's functional abilities, safety, and mobility and would benefit from skilled PT intervention. Recommendation is to continue skilled PT services to maximize function and safety.       Recommendations for follow up therapy are one component of a multi-disciplinary discharge planning process, led by the attending physician.  Recommendations may be updated based on patient status, additional functional criteria and insurance authorization.  Follow Up Recommendations       Assistance Recommended at Discharge Intermittent Supervision/Assistance  Patient can return home with the following  Assist for transportation;Assistance with cooking/housework;Help with stairs or ramp for entrance    Equipment Recommendations Rolling walker (2 wheels)  Recommendations for Other Services       Functional Status Assessment Patient has had a recent decline in their functional status and demonstrates the ability to make significant improvements in function in a  reasonable and predictable amount of time.     Precautions / Restrictions Precautions Precautions: Fall Restrictions Weight Bearing Restrictions: No      Mobility  Bed Mobility               General bed mobility comments: pt up in recliner at start/end of session    Transfers Overall transfer level: Needs assistance Equipment used: Rolling walker (2 wheels), None Transfers: Sit to/from Stand Sit to Stand: Min guard, Supervision           General transfer comment: supervision with RW    Ambulation/Gait Ambulation/Gait assistance: Supervision Gait Distance (Feet): 200 Feet (66ft without RW, improved safety and steadiness with RW) Assistive device: Rolling walker (2 wheels) Gait Pattern/deviations: Step-through pattern          Stairs            Wheelchair Mobility    Modified Rankin (Stroke Patients Only)       Balance Overall balance assessment: Needs assistance Sitting-balance support: Feet supported Sitting balance-Leahy Scale: Good     Standing balance support: During functional activity, Bilateral upper extremity supported Standing balance-Leahy Scale: Good                               Pertinent Vitals/Pain Pain Assessment Pain Assessment: No/denies pain    Home Living Family/patient expects to be discharged to:: Private residence Living Arrangements: Children Available Help at Discharge: Family Type of Home: House Home Access: Level entry       Home Layout: One level        Prior Function Prior Level of Function : Independent/Modified Independent  Hand Dominance        Extremity/Trunk Assessment   Upper Extremity Assessment Upper Extremity Assessment: Overall WFL for tasks assessed    Lower Extremity Assessment Lower Extremity Assessment: Generalized weakness       Communication   Communication: No difficulties  Cognition Arousal/Alertness: Awake/alert Behavior During  Therapy: WFL for tasks assessed/performed Overall Cognitive Status: Within Functional Limits for tasks assessed                                          General Comments      Exercises     Assessment/Plan    PT Assessment Patient needs continued PT services  PT Problem List Decreased strength;Decreased mobility;Decreased activity tolerance;Decreased balance       PT Treatment Interventions DME instruction;Therapeutic activities;Gait training;Therapeutic exercise;Patient/family education;Stair training;Balance training;Functional mobility training;Neuromuscular re-education    PT Goals (Current goals can be found in the Care Plan section)  Acute Rehab PT Goals Patient Stated Goal: to go home PT Goal Formulation: With patient Time For Goal Achievement: 09/21/22 Potential to Achieve Goals: Good    Frequency Min 2X/week     Co-evaluation               AM-PAC PT "6 Clicks" Mobility  Outcome Measure Help needed turning from your back to your side while in a flat bed without using bedrails?: None Help needed moving from lying on your back to sitting on the side of a flat bed without using bedrails?: None Help needed moving to and from a bed to a chair (including a wheelchair)?: None Help needed standing up from a chair using your arms (e.g., wheelchair or bedside chair)?: None Help needed to walk in hospital room?: None Help needed climbing 3-5 steps with a railing? : A Little 6 Click Score: 23    End of Session Equipment Utilized During Treatment: Gait belt Activity Tolerance: Patient tolerated treatment well Patient left: in chair Nurse Communication: Mobility status PT Visit Diagnosis: Other abnormalities of gait and mobility (R26.89)    Time: 8119-1478 PT Time Calculation (min) (ACUTE ONLY): 12 min   Charges:   PT Evaluation $PT Eval Low Complexity: 1 Low PT Treatments $Therapeutic Activity: 8-22 mins        Olga Coaster PT,  DPT 11:53 AM,09/07/22

## 2022-09-07 NOTE — Care Management Important Message (Signed)
Important Message  Patient Details  Name: CIERRIA KOGAN MRN: [id number] Date of Birth: 11/12/1945   Medicare Important Message Given:  Yes     Olegario Messier A Albeiro Trompeter 09/07/2022, 12:55 PM

## 2022-09-07 NOTE — Plan of Care (Signed)

## 2022-09-08 LAB — CULTURE, BLOOD (ROUTINE X 2)

## 2022-09-09 LAB — CULTURE, BLOOD (ROUTINE X 2): Culture: NO GROWTH

## 2022-09-10 LAB — CULTURE, BLOOD (ROUTINE X 2)
Culture: NO GROWTH
Special Requests: ADEQUATE
Special Requests: ADEQUATE

## 2022-09-14 ENCOUNTER — Ambulatory Visit (INDEPENDENT_AMBULATORY_CARE_PROVIDER_SITE_OTHER): Payer: 59 | Admitting: Nurse Practitioner

## 2023-07-22 ENCOUNTER — Other Ambulatory Visit: Payer: Self-pay | Admitting: Internal Medicine

## 2023-07-22 DIAGNOSIS — Z1231 Encounter for screening mammogram for malignant neoplasm of breast: Secondary | ICD-10-CM

## 2023-08-12 ENCOUNTER — Other Ambulatory Visit: Payer: Self-pay | Admitting: Internal Medicine

## 2023-08-12 DIAGNOSIS — R413 Other amnesia: Secondary | ICD-10-CM

## 2023-08-12 DIAGNOSIS — I482 Chronic atrial fibrillation, unspecified: Secondary | ICD-10-CM

## 2023-08-23 ENCOUNTER — Ambulatory Visit
Admission: RE | Admit: 2023-08-23 | Discharge: 2023-08-23 | Disposition: A | Discharge status: Home / Self Care | Source: Ambulatory Visit | Attending: Internal Medicine | Admitting: Internal Medicine

## 2023-08-23 DIAGNOSIS — I482 Chronic atrial fibrillation, unspecified: Secondary | ICD-10-CM | POA: Insufficient documentation

## 2023-08-23 DIAGNOSIS — R413 Other amnesia: Secondary | ICD-10-CM | POA: Insufficient documentation

## 2023-08-28 ENCOUNTER — Ambulatory Visit
Admission: RE | Admit: 2023-08-28 | Discharge: 2023-08-28 | Disposition: A | Discharge status: Home / Self Care | Source: Ambulatory Visit | Attending: Internal Medicine | Admitting: Internal Medicine

## 2023-08-28 DIAGNOSIS — Z1231 Encounter for screening mammogram for malignant neoplasm of breast: Secondary | ICD-10-CM | POA: Diagnosis present

## 2023-10-28 ENCOUNTER — Other Ambulatory Visit (HOSPITAL_COMMUNITY): Payer: Self-pay

## 2024-02-26 ENCOUNTER — Encounter: Payer: Self-pay | Admitting: *Deleted

## 2024-03-03 ENCOUNTER — Encounter
Admission: RE | Disposition: A | Discharge status: Home / Self Care | Payer: Self-pay | Source: Home / Self Care | Attending: Gastroenterology

## 2024-03-03 ENCOUNTER — Ambulatory Visit: Admitting: Anesthesiology

## 2024-03-03 ENCOUNTER — Other Ambulatory Visit: Payer: Self-pay

## 2024-03-03 ENCOUNTER — Ambulatory Visit
Admission: RE | Admit: 2024-03-03 | Discharge: 2024-03-03 | Disposition: A | Discharge status: Home / Self Care | Attending: Gastroenterology | Admitting: Gastroenterology

## 2024-03-03 DIAGNOSIS — D122 Benign neoplasm of ascending colon: Secondary | ICD-10-CM | POA: Insufficient documentation

## 2024-03-03 DIAGNOSIS — D123 Benign neoplasm of transverse colon: Secondary | ICD-10-CM | POA: Diagnosis not present

## 2024-03-03 DIAGNOSIS — E785 Hyperlipidemia, unspecified: Secondary | ICD-10-CM | POA: Diagnosis not present

## 2024-03-03 DIAGNOSIS — E669 Obesity, unspecified: Secondary | ICD-10-CM | POA: Diagnosis not present

## 2024-03-03 DIAGNOSIS — Z7901 Long term (current) use of anticoagulants: Secondary | ICD-10-CM | POA: Diagnosis not present

## 2024-03-03 DIAGNOSIS — E119 Type 2 diabetes mellitus without complications: Secondary | ICD-10-CM | POA: Insufficient documentation

## 2024-03-03 DIAGNOSIS — K641 Second degree hemorrhoids: Secondary | ICD-10-CM | POA: Insufficient documentation

## 2024-03-03 DIAGNOSIS — I10 Essential (primary) hypertension: Secondary | ICD-10-CM | POA: Insufficient documentation

## 2024-03-03 DIAGNOSIS — Z79899 Other long term (current) drug therapy: Secondary | ICD-10-CM | POA: Diagnosis not present

## 2024-03-03 DIAGNOSIS — I4891 Unspecified atrial fibrillation: Secondary | ICD-10-CM | POA: Insufficient documentation

## 2024-03-03 DIAGNOSIS — Z1211 Encounter for screening for malignant neoplasm of colon: Secondary | ICD-10-CM | POA: Insufficient documentation

## 2024-03-03 DIAGNOSIS — D120 Benign neoplasm of cecum: Secondary | ICD-10-CM | POA: Diagnosis not present

## 2024-03-03 DIAGNOSIS — K5730 Diverticulosis of large intestine without perforation or abscess without bleeding: Secondary | ICD-10-CM | POA: Diagnosis not present

## 2024-03-03 DIAGNOSIS — K635 Polyp of colon: Secondary | ICD-10-CM | POA: Diagnosis not present

## 2024-03-03 HISTORY — DX: Postmenopausal bleeding: N95.0

## 2024-03-03 HISTORY — DX: Prediabetes: R73.03

## 2024-03-03 HISTORY — PX: HEMOSTASIS CLIP PLACEMENT: SHX6857

## 2024-03-03 HISTORY — PX: POLYPECTOMY: SHX149

## 2024-03-03 HISTORY — PX: COLONOSCOPY: SHX5424

## 2024-03-03 HISTORY — DX: Localized edema: R60.0

## 2024-03-03 LAB — GLUCOSE, CAPILLARY: Glucose-Capillary: 121 mg/dL — ABNORMAL HIGH (ref 70–99)

## 2024-03-03 SURGERY — COLONOSCOPY
Anesthesia: General

## 2024-03-03 MED ORDER — PROPOFOL 10 MG/ML IV BOLUS
INTRAVENOUS | Status: DC | PRN
  Administered 2024-03-03: 50 mg via INTRAVENOUS

## 2024-03-03 MED ORDER — GLYCOPYRROLATE 0.2 MG/ML IJ SOLN
INTRAMUSCULAR | Status: AC
Start: 2024-03-03 — End: 2024-03-03
  Filled 2024-03-03: qty 1

## 2024-03-03 MED ORDER — SODIUM CHLORIDE 0.9 % IV SOLN: INTRAVENOUS | Status: DC

## 2024-03-03 MED ORDER — GLYCOPYRROLATE 0.2 MG/ML IJ SOLN
INTRAMUSCULAR | Status: DC | PRN
  Administered 2024-03-03: .2 mg via INTRAVENOUS

## 2024-03-03 MED ORDER — LIDOCAINE HCL (CARDIAC) PF 100 MG/5ML IV SOSY
PREFILLED_SYRINGE | INTRAVENOUS | Status: DC | PRN
  Administered 2024-03-03: 60 mg via INTRAVENOUS

## 2024-03-03 MED ORDER — SPOT INK MARKER SYRINGE KIT: PACK | SUBMUCOSAL | Status: DC | PRN

## 2024-03-03 MED ORDER — LIDOCAINE HCL (PF) 2 % IJ SOLN
INTRAMUSCULAR | Status: AC
  Filled 2024-03-03: qty 5

## 2024-03-03 MED ORDER — DEXMEDETOMIDINE HCL IN NACL 80 MCG/20ML IV SOLN
INTRAVENOUS | Status: DC | PRN
  Administered 2024-03-03: 4 ug via INTRAVENOUS
  Administered 2024-03-03 (×2): 8 ug via INTRAVENOUS

## 2024-03-03 MED ORDER — EPHEDRINE SULFATE-NACL 50-0.9 MG/10ML-% IV SOSY
PREFILLED_SYRINGE | INTRAVENOUS | Status: DC | PRN
  Administered 2024-03-03: 10 mg via INTRAVENOUS

## 2024-03-03 MED ORDER — DEXMEDETOMIDINE HCL IN NACL 80 MCG/20ML IV SOLN
INTRAVENOUS | Status: AC
Start: 2024-03-03 — End: 2024-03-03
  Filled 2024-03-03: qty 20

## 2024-03-03 MED ORDER — PROPOFOL 500 MG/50ML IV EMUL
INTRAVENOUS | Status: DC | PRN
  Administered 2024-03-03: 75 ug/kg/min via INTRAVENOUS

## 2024-03-03 NOTE — Transfer of Care (Signed)
 Immediate Anesthesia Transfer of Care Note  Patient: Brandi Poole  Procedure(s) Performed: COLONOSCOPY POLYPECTOMY, INTESTINE CONTROL OF HEMORRHAGE, GI TRACT, ENDOSCOPIC, BY CLIPPING OR OVERSEWING  Patient Location: PACU  Anesthesia Type:General  Level of Consciousness: sedated  Airway & Oxygen Therapy: Patient Spontanous Breathing  Post-op Assessment: Report given to RN and Post -op Vital signs reviewed and stable  Post vital signs: Reviewed and stable  Last Vitals:  Vitals Value Taken Time  BP 98/57 03/03/24 11:14  Temp    Pulse 83 03/03/24 11:15  Resp 21 03/03/24 11:15  SpO2 94 % 03/03/24 11:15  Vitals shown include unfiled device data.  Last Pain:  Vitals:   03/03/24 0959  TempSrc: Temporal  PainSc: 0-No pain         Complications: No notable events documented.

## 2024-03-03 NOTE — Op Note (Signed)
 [facility] Gastroenterology Patient Name: Brandi Poole Procedure Date: 03/03/2024 10:20 AM MRN: [id number] Account #: [id number] Date of Birth: 20-Oct-1945 Admit Type: Outpatient Age: 78 Room: [facility] ENDO ROOM 1 Gender: Female Note Status: Finalized Instrument Name: Colon Scope [id number] Procedure:             Colonoscopy Indications:           Surveillance: Personal history of adenomatous polyps                         on last colonoscopy 3 years ago Providers:             Ole Schick MD, MD Referring MD:          Reyes BIRCH. Auston, MD (Referring MD) Medicines:             Monitored Anesthesia Care Complications:         No immediate complications. Estimated blood loss:                         Minimal. Procedure:             Pre-Anesthesia Assessment:                        - Prior to the procedure, a History and Physical was                         performed, and patient medications and allergies were                         reviewed. The patient is competent. The risks and                         benefits of the procedure and the sedation options and                         risks were discussed with the patient. All questions                         were answered and informed consent was obtained.                         Patient identification and proposed procedure were                         verified by the physician, the nurse, the                         anesthesiologist, the anesthetist and the technician                         in the endoscopy suite. Mental Status Examination:                         alert and oriented. Airway Examination: normal                         oropharyngeal airway and neck mobility. Respiratory  Examination: clear to auscultation. CV Examination:                         normal. Prophylactic Antibiotics: The patient does not                         require prophylactic antibiotics. Prior                          Anticoagulants: The patient has taken Coumadin                          (warfarin), last dose was 5 days prior to procedure.                         ASA Grade Assessment: III - A patient with severe                         systemic disease. After reviewing the risks and                         benefits, the patient was deemed in satisfactory                         condition to undergo the procedure. The anesthesia                         plan was to use monitored anesthesia care (MAC).                         Immediately prior to administration of medications,                         the patient was re-assessed for adequacy to receive                         sedatives. The heart rate, respiratory rate, oxygen                         saturations, blood pressure, adequacy of pulmonary                         ventilation, and response to care were monitored                         throughout the procedure. The physical status of the                         patient was re-assessed after the procedure.                        After obtaining informed consent, the colonoscope was                         passed under direct vision. Throughout the procedure,                         the patient's blood pressure, pulse, and oxygen  saturations were monitored continuously. The                         Colonoscope was introduced through the anus and                         advanced to the the terminal ileum, with                         identification of the appendiceal orifice and IC                         valve. The colonoscopy was performed without                         difficulty. The patient tolerated the procedure well.                         The quality of the bowel preparation was good. The                         ileocecal valve, appendiceal orifice, and rectum were                         photographed. Findings:      The perianal and digital rectal  examinations were normal.      The terminal ileum appeared normal.      A 3 mm polyp was found in the cecum. The polyp was sessile. The polyp       was removed with a cold snare. Resection and retrieval were complete.       Estimated blood loss was minimal.      A 12 mm polyp was found in the proximal ascending colon. The polyp was       mucous-capped. Preparations were made for mucosal resection. Demarcation       of the lesion was performed with narrow band imaging to clearly identify       the boundaries of the lesion. Eleview was injected to raise the lesion.       Snare mucosal resection was performed. Resection and retrieval were       complete. Resected tissue margins were examined and clear of polyp       tissue. To prevent bleeding after the polypectomy, two hemostatic clips       were successfully placed. There was no bleeding during, or at the end,       of the procedure.      A 14 mm polyp was found in the mid ascending colon. The polyp was flat.       Preparations were made for mucosal resection. Demarcation of the lesion       was performed with narrow band imaging to clearly identify the       boundaries of the lesion. Eleview was injected to raise the lesion.       Snare mucosal resection was performed. Resection and retrieval were       complete. Resected tissue margins were examined and clear of polyp       tissue. To prevent bleeding after the polypectomy, three hemostatic       clips were successfully placed. There was no bleeding during, or at the  end, of the procedure.      Two sessile polyps were found in the distal ascending colon. The polyps       were 2 to 3 mm in size. These polyps were removed with a cold snare.       Resection and retrieval were complete. Estimated blood loss was minimal.      A 3 mm polyp was found in the transverse colon. The polyp was sessile.       The polyp was removed with a cold snare. Resection and retrieval were       complete.  Estimated blood loss was minimal.      A 3 mm polyp was found in the sigmoid colon. The polyp was sessile. The       polyp was removed with a cold snare. Resection and retrieval were       complete. Estimated blood loss was minimal.      Multiple small-mouthed diverticula were found in the sigmoid colon.      Internal hemorrhoids were found during retroflexion. The hemorrhoids       were Grade II (internal hemorrhoids that prolapse but reduce       spontaneously).      The exam was otherwise without abnormality on direct and retroflexion       views. Impression:            - The examined portion of the ileum was normal.                        - One 3 mm polyp in the cecum, removed with a cold                         snare. Resected and retrieved.                        - One 12 mm polyp in the proximal ascending colon,                         removed with mucosal resection. Resected and                         retrieved. Clips were placed.                        - One 14 mm polyp in the mid ascending colon, removed                         with mucosal resection. Resected and retrieved. Clips                         were placed.                        - Two 2 to 3 mm polyps in the distal ascending colon,                         removed with a cold snare. Resected and retrieved.                        - One 3 mm polyp in the transverse colon, removed with  a cold snare. Resected and retrieved.                        - One 3 mm polyp in the sigmoid colon, removed with a                         cold snare. Resected and retrieved.                        - Diverticulosis in the sigmoid colon.                        - Internal hemorrhoids.                        - The examination was otherwise normal on direct and                         retroflexion views.                        - Mucosal resection was performed. Resection and                         retrieval were  complete.                        - Mucosal resection was performed. Resection and                         retrieval were complete. Recommendation:        - Discharge patient to home.                        - Resume previous diet.                        - Resume Coumadin  (warfarin) at prior dose tomorrow.                         Refer to managing physician for further adjustment of                         therapy.                        - Await pathology results.                        - Repeat colonoscopy for surveillance based on                         pathology results.                        - Return to referring physician as previously                         scheduled. Procedure Code(s):     --- Professional ---                        [id number], Colonoscopy, flexible; with  endoscopic mucosal                         resection                        45385, 59, Colonoscopy, flexible; with removal of                         tumor(s), polyp(s), or other lesion(s) by snare                         technique Diagnosis Code(s):     --- Professional ---                        Z86.010, Personal history of colonic polyps                        K64.1, Second degree hemorrhoids                        D12.0, Benign neoplasm of cecum                        D12.2, Benign neoplasm of ascending colon                        D12.3, Benign neoplasm of transverse colon (hepatic                         flexure or splenic flexure)                        D12.5, Benign neoplasm of sigmoid colon                        K57.30, Diverticulosis of large intestine without                         perforation or abscess without bleeding CPT copyright 2022 [employer]. All rights reserved. The codes documented in this report are preliminary and upon coder review may  be revised to meet current compliance requirements. Ole Schick MD, MD 03/03/2024 11:22:45 AM Number of Addenda: 0 Note Initiated  On: 03/03/2024 10:20 AM Scope Withdrawal Time: 0 hours 23 minutes 30 seconds  Total Procedure Duration: 0 hours 41 minutes 1 second  Estimated Blood Loss:  Estimated blood loss was minimal.      [facility]

## 2024-03-03 NOTE — H&P (Signed)
 Outpatient short stay form Pre-procedure 03/03/2024  Brandi ONEIDA Schick, MD  Primary Physician: Auston Reyes BIRCH, MD  Reason for visit:  Surveillance  History of present illness:    78 y/o lady with history of hypertension, HLD, obesity, and a. Fib on coumadin  with last dose 5 days ago here for surveillance colonoscopy. Last colonoscopy 3 years ago with several polyps. No significant abdominal surgeries. No first degree relatives with GI malignancies.    Current Facility-Administered Medications:    0.9 %  sodium chloride  infusion, , Intravenous, Continuous, Jeremaih Klima, Brandi ONEIDA, MD, Last Rate: 20 mL/hr at 03/03/24 1016, New Bag at 03/03/24 1016  Medications Prior to Admission  Medication Sig Dispense Refill Last Dose/Taking   losartan (COZAAR) 25 MG tablet Take 25 mg by mouth daily.   03/03/2024 Morning   metoprolol  tartrate (LOPRESSOR ) 25 MG tablet Take 25 mg by mouth 2 (two) times daily.   03/03/2024 Morning   TIADYLT  ER 240 MG 24 hr capsule Take 240 mg by mouth daily.   03/03/2024 Morning   atorvastatin  (LIPITOR) 10 MG tablet Take 10 mg by mouth daily. (Patient not taking: Reported on 03/03/2024)   Not Taking   famotidine  (PEPCID ) 20 MG tablet Take 20 mg by mouth every morning.       furosemide  (LASIX ) 20 MG tablet Take 1 tablet (20 mg total) by mouth daily. 30 tablet 1    hydrocortisone  cream 0.5 % Apply 1 application topically 2 (two) times daily. To bilateral lower extremities (Patient not taking: Reported on 09/03/2022) 30 g 0    potassium chloride  SA (KLOR-CON  M) 20 MEQ tablet Take 2 tablets (40 mEq total) by mouth daily for 3 days. 6 tablet 0    warfarin (COUMADIN ) 2 MG tablet Take 2 mg by mouth daily. 2 mg Tuesday, Thursday and Saturday. 4 mg Sunday, Monday, Wednesday and Friday      warfarin (COUMADIN ) 4 MG tablet Take 4 mg by mouth daily. 4 mg Sunday, Monday, Wednesday and Friday. 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    Physical Exam  Gen: Alert, oriented. Appears stated age.  HEENT: PERRLA. Lungs: No respiratory distress CV: RRR Abd: soft, benign, no masses Ext: No edema    Planned procedures: Proceed with colonoscopy. The patient understands the nature of the planned procedure, indications, risks, alternatives and potential complications including but not limited to bleeding, infection, perforation, damage to internal organs and possible oversedation/side effects from anesthesia. The patient agrees and gives consent to proceed.  Please refer to procedure notes for findings, recommendations and patient disposition/instructions.     Brandi ONEIDA Schick, MD [facility] Gastroenterology

## 2024-03-03 NOTE — Interval H&P Note (Signed)
 History and Physical Interval Note:  03/03/2024 10:20 AM  Brandi Poole  has presented today for surgery, with the diagnosis of Personal history of colon polyps, unspecified (Z86.0100).  The various methods of treatment have been discussed with the patient and family. After consideration of risks, benefits and other options for treatment, the patient has consented to  Procedure(s) with comments: COLONOSCOPY (N/A) - Coumadin  as a surgical intervention.  The patient's history has been reviewed, patient examined, no change in status, stable for surgery.  I have reviewed the patient's chart and labs.  Questions were answered to the patient's satisfaction.     Brandi Poole  Ok to proceed with colonoscopy

## 2024-03-03 NOTE — Anesthesia Preprocedure Evaluation (Signed)
 Anesthesia Evaluation  Patient identified by MRN, date of birth, ID band Patient awake    Reviewed: Allergy & Precautions, H&P , NPO status , Patient's Chart, lab work & pertinent test results, reviewed documented beta blocker date and time   Airway Mallampati: II   Neck ROM: full    Dental  (+) Poor Dentition   Pulmonary neg pulmonary ROS   Pulmonary exam normal        Cardiovascular Exercise Tolerance: Poor hypertension, On Medications negative cardio ROS Normal cardiovascular exam Rhythm:regular Rate:Normal     Neuro/Psych   Anxiety Depression    negative neurological ROS  negative psych ROS   GI/Hepatic Neg liver ROS,GERD  Medicated,,  Endo/Other  negative endocrine ROSdiabetes, Type 2, Oral Hypoglycemic Agents    Renal/GU negative Renal ROS  negative genitourinary   Musculoskeletal   Abdominal   Peds  Hematology negative hematology ROS (+)   Anesthesia Other Findings Past Medical History: No date: Anxiety No date: Atrial fibrillation (HCC) No date: Bilateral leg edema No date: Cancer (HCC)     Comment:  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 No date: Depression No date: Diabetes mellitus without complication (HCC)     Comment:  NO MEDS-DIET CONTROLLED No date: Fibrocystic breast disease No date: GERD (gastroesophageal reflux disease) No date: HLD (hyperlipidemia) No date: Hypertension No date: Obesity No date: PMB (postmenopausal bleeding) No date: Pre-diabetes Past Surgical History: No date: BREAST CYST ASPIRATION No date: CARPAL TUNNEL RELEASE 03/14/2021: COLONOSCOPY WITH PROPOFOL ; N/A     Comment:  Procedure: COLONOSCOPY WITH PROPOFOL ;  Surgeon:               Maryruth Ole DASEN, MD;  Location: [facility] ENDOSCOPY;                Service: Endoscopy;  Laterality: N/A; 02/08/2017: DILATATION & CURETTAGE/HYSTEROSCOPY WITH MYOSURE; N/A     Comment:  Procedure: DILATATION & CURETTAGE/HYSTEROSCOPY WITH                MYOSURE;  Surgeon: Ward, Mitzie BROCKS, MD;  Location: [facility]               ORS;  Service: Gynecology;  Laterality: N/A; No date: FRACTURE SURGERY     Comment:  BROKEN WRIST RIGHT AND BROKEN KNEE LEFT No date: KNEE SURGERY No date: removal on basal cell cancer BMI    Body Mass Index: 32.93 kg/m     Reproductive/Obstetrics negative OB ROS                              Anesthesia Physical Anesthesia Plan  ASA: 3  Anesthesia Plan: General   Post-op Pain Management:    Induction:   PONV Risk Score and Plan:   Airway Management Planned:   Additional Equipment:   Intra-op Plan:   Post-operative Plan:   Informed Consent: I have reviewed the patients History and Physical, chart, labs and discussed the procedure including the risks, benefits and alternatives for the proposed anesthesia with the patient or authorized representative who has indicated his/her understanding and acceptance.     Dental Advisory Given  Plan Discussed with: CRNA  Anesthesia Plan Comments:         Anesthesia Quick Evaluation

## 2024-03-03 NOTE — Anesthesia Postprocedure Evaluation (Signed)
 Anesthesia Post Note  Patient: Brandi Poole  Procedure(s) Performed: COLONOSCOPY POLYPECTOMY, INTESTINE CONTROL OF HEMORRHAGE, GI TRACT, ENDOSCOPIC, BY CLIPPING OR OVERSEWING  Patient location during evaluation: PACU Anesthesia Type: General Level of consciousness: awake and alert Pain management: pain level controlled Vital Signs Assessment: post-procedure vital signs reviewed and stable Respiratory status: spontaneous breathing, nonlabored ventilation, respiratory function stable and patient connected to nasal cannula oxygen Cardiovascular status: blood pressure returned to baseline and stable Postop Assessment: no apparent nausea or vomiting Anesthetic complications: no   No notable events documented.   Last Vitals:  Vitals:   03/03/24 0959 03/03/24 1114  BP: 130/68 (!) 98/57  Pulse: 80 82  Resp: 14 18  Temp:  (!) 35.8 C  SpO2: 94% 93%    Last Pain:  Vitals:   03/03/24 1114  TempSrc: Temporal  PainSc:                  [place] KANDICE Clause

## 2024-03-04 LAB — SURGICAL PATHOLOGY
